# Patient Record
Sex: Male | Born: 1975 | Race: White | Hispanic: No | Marital: Married | Smoking: Former smoker
Health system: Southern US, Community
[De-identification: ages and names within clinical notes are randomized; demographics above are authoritative.]

## PROBLEM LIST (undated history)

## (undated) ENCOUNTER — Emergency Department (HOSPITAL_COMMUNITY): Admission: EM | Payer: Commercial Managed Care - HMO | Source: Home / Self Care

## (undated) DIAGNOSIS — J45909 Unspecified asthma, uncomplicated: Secondary | ICD-10-CM

## (undated) DIAGNOSIS — R519 Headache, unspecified: Secondary | ICD-10-CM

## (undated) DIAGNOSIS — G4700 Insomnia, unspecified: Secondary | ICD-10-CM

## (undated) DIAGNOSIS — J189 Pneumonia, unspecified organism: Secondary | ICD-10-CM

## (undated) DIAGNOSIS — Z973 Presence of spectacles and contact lenses: Secondary | ICD-10-CM

## (undated) DIAGNOSIS — J309 Allergic rhinitis, unspecified: Secondary | ICD-10-CM

## (undated) DIAGNOSIS — R942 Abnormal results of pulmonary function studies: Secondary | ICD-10-CM

## (undated) DIAGNOSIS — Z8489 Family history of other specified conditions: Secondary | ICD-10-CM

## (undated) DIAGNOSIS — E161 Other hypoglycemia: Secondary | ICD-10-CM

## (undated) DIAGNOSIS — K429 Umbilical hernia without obstruction or gangrene: Secondary | ICD-10-CM

## (undated) DIAGNOSIS — R112 Nausea with vomiting, unspecified: Secondary | ICD-10-CM

## (undated) DIAGNOSIS — I10 Essential (primary) hypertension: Secondary | ICD-10-CM

## (undated) DIAGNOSIS — R51 Headache: Secondary | ICD-10-CM

## (undated) DIAGNOSIS — Z9889 Other specified postprocedural states: Secondary | ICD-10-CM

## (undated) DIAGNOSIS — R079 Chest pain, unspecified: Secondary | ICD-10-CM

## (undated) HISTORY — DX: Allergic rhinitis, unspecified: J30.9

## (undated) HISTORY — DX: Abnormal results of pulmonary function studies: R94.2

## (undated) HISTORY — DX: Essential (primary) hypertension: I10

## (undated) HISTORY — PX: CIRCUMCISION: SUR203

## (undated) HISTORY — PX: ADENOIDECTOMY: SUR15

## (undated) HISTORY — PX: MYRINGOTOMY WITH TUBE PLACEMENT: SHX5663

## (undated) HISTORY — PX: WISDOM TOOTH EXTRACTION: SHX21

## (undated) HISTORY — DX: Chest pain, unspecified: R07.9

## (undated) HISTORY — DX: Insomnia, unspecified: G47.00

---

## 2001-12-21 ENCOUNTER — Encounter: Payer: Self-pay | Admitting: Family Medicine

## 2001-12-21 ENCOUNTER — Encounter: Admission: RE | Admit: 2001-12-21 | Discharge: 2001-12-21 | Payer: Self-pay | Admitting: Family Medicine

## 2002-01-12 ENCOUNTER — Encounter: Admission: RE | Admit: 2002-01-12 | Discharge: 2002-01-12 | Payer: Self-pay | Admitting: Family Medicine

## 2003-02-11 ENCOUNTER — Emergency Department (HOSPITAL_COMMUNITY): Admission: EM | Admit: 2003-02-11 | Discharge: 2003-02-11 | Payer: Self-pay | Admitting: Emergency Medicine

## 2003-02-11 ENCOUNTER — Encounter: Payer: Self-pay | Admitting: Emergency Medicine

## 2003-02-13 ENCOUNTER — Encounter: Admission: RE | Admit: 2003-02-13 | Discharge: 2003-03-16 | Payer: Self-pay | Admitting: Family Medicine

## 2015-02-23 ENCOUNTER — Other Ambulatory Visit: Payer: Self-pay | Admitting: Family Medicine

## 2015-02-23 ENCOUNTER — Ambulatory Visit
Admission: RE | Admit: 2015-02-23 | Discharge: 2015-02-23 | Disposition: A | Discharge status: Home / Self Care | Payer: 59 | Source: Ambulatory Visit | Attending: Family Medicine | Admitting: Family Medicine

## 2015-02-23 DIAGNOSIS — R059 Cough, unspecified: Secondary | ICD-10-CM

## 2015-02-23 DIAGNOSIS — R05 Cough: Secondary | ICD-10-CM

## 2015-02-23 DIAGNOSIS — R0600 Dyspnea, unspecified: Secondary | ICD-10-CM

## 2015-04-24 ENCOUNTER — Other Ambulatory Visit: Payer: Self-pay | Admitting: *Deleted

## 2015-04-24 DIAGNOSIS — R0600 Dyspnea, unspecified: Secondary | ICD-10-CM

## 2015-04-25 ENCOUNTER — Ambulatory Visit (INDEPENDENT_AMBULATORY_CARE_PROVIDER_SITE_OTHER): Payer: 59 | Admitting: Internal Medicine

## 2015-04-25 DIAGNOSIS — R0600 Dyspnea, unspecified: Secondary | ICD-10-CM | POA: Diagnosis not present

## 2015-04-25 LAB — PULMONARY FUNCTION TEST
DL/VA % pred: 140 %
DL/VA: 7.17 ml/min/mmHg/L
DLCO unc % pred: 84 %
DLCO unc: 37.66 ml/min/mmHg
FEF 25-75 Post: 2.27 L/sec
FEF 25-75 Pre: 2.23 L/sec
FEF2575-%Change-Post: 1 %
FEF2575-%Pred-Post: 45 %
FEF2575-%Pred-Pre: 45 %
FEV1-%Change-Post: 0 %
FEV1-%Pred-Post: 53 %
FEV1-%Pred-Pre: 53 %
FEV1-Post: 2.94 L
FEV1-Pre: 2.92 L
FEV1FVC-%Change-Post: -2 %
FEV1FVC-%Pred-Pre: 94 %
FEV6-%Change-Post: 2 %
FEV6-%Pred-Post: 58 %
FEV6-%Pred-Pre: 56 %
FEV6-Post: 3.97 L
FEV6-Pre: 3.86 L
FEV6FVC-%Change-Post: 0 %
FEV6FVC-%Pred-Post: 101 %
FEV6FVC-%Pred-Pre: 101 %
FVC-%Change-Post: 2 %
FVC-%Pred-Post: 57 %
FVC-%Pred-Pre: 55 %
FVC-Post: 3.98 L
FVC-Pre: 3.87 L
Post FEV1/FVC ratio: 74 %
Post FEV6/FVC ratio: 100 %
Pre FEV1/FVC ratio: 75 %
Pre FEV6/FVC Ratio: 100 %
RV % pred: 72 %
RV: 1.67 L
TLC % pred: 69 %
TLC: 6.08 L

## 2015-04-25 NOTE — Progress Notes (Signed)
PFT done today. 

## 2015-04-30 ENCOUNTER — Telehealth: Payer: Self-pay | Admitting: Internal Medicine

## 2015-04-30 NOTE — Telephone Encounter (Signed)
Called and spoke to [place] at Dr. Rondel Baton office, faxed final report to [place].  Nothing further needed.

## 2015-05-03 ENCOUNTER — Institutional Professional Consult (permissible substitution): Payer: 59 | Admitting: Internal Medicine

## 2015-05-07 ENCOUNTER — Ambulatory Visit (INDEPENDENT_AMBULATORY_CARE_PROVIDER_SITE_OTHER)
Admission: RE | Admit: 2015-05-07 | Discharge: 2015-05-07 | Disposition: A | Discharge status: Home / Self Care | Payer: 59 | Source: Ambulatory Visit | Attending: Internal Medicine | Admitting: Internal Medicine

## 2015-05-07 ENCOUNTER — Other Ambulatory Visit (INDEPENDENT_AMBULATORY_CARE_PROVIDER_SITE_OTHER): Payer: 59

## 2015-05-07 ENCOUNTER — Ambulatory Visit (INDEPENDENT_AMBULATORY_CARE_PROVIDER_SITE_OTHER): Payer: 59 | Admitting: Internal Medicine

## 2015-05-07 ENCOUNTER — Encounter: Payer: Self-pay | Admitting: Internal Medicine

## 2015-05-07 VITALS — BP 124/80 | HR 110 | Ht 69.0 in | Wt 227.0 lb

## 2015-05-07 DIAGNOSIS — R05 Cough: Secondary | ICD-10-CM

## 2015-05-07 DIAGNOSIS — E669 Obesity, unspecified: Secondary | ICD-10-CM

## 2015-05-07 DIAGNOSIS — J45991 Cough variant asthma: Secondary | ICD-10-CM | POA: Diagnosis not present

## 2015-05-07 DIAGNOSIS — R058 Other specified cough: Secondary | ICD-10-CM

## 2015-05-07 LAB — CBC WITH DIFFERENTIAL/PLATELET
BASOS PCT: 0.3 % (ref 0.0–3.0)
Basophils Absolute: 0 10*3/uL (ref 0.0–0.1)
EOS ABS: 0 10*3/uL (ref 0.0–0.7)
Eosinophils Relative: 0.2 % (ref 0.0–5.0)
HCT: 47.1 % (ref 39.0–52.0)
Hemoglobin: 16 g/dL (ref 13.0–17.0)
LYMPHS PCT: 18.6 % (ref 12.0–46.0)
Lymphs Abs: 2 10*3/uL (ref 0.7–4.0)
MCHC: 34.1 g/dL (ref 30.0–36.0)
MCV: 96.2 fl (ref 78.0–100.0)
Monocytes Absolute: 0.8 10*3/uL (ref 0.1–1.0)
Monocytes Relative: 6.9 % (ref 3.0–12.0)
NEUTROS PCT: 74 % (ref 43.0–77.0)
Neutro Abs: 8.1 10*3/uL — ABNORMAL HIGH (ref 1.4–7.7)
Platelets: 225 10*3/uL (ref 150.0–400.0)
RBC: 4.9 Mil/uL (ref 4.22–5.81)
RDW: 13.1 % (ref 11.5–15.5)
WBC: 10.9 10*3/uL — ABNORMAL HIGH (ref 4.0–10.5)

## 2015-05-07 MED ORDER — BUDESONIDE-FORMOTEROL FUMARATE 80-4.5 MCG/ACT IN AERO: 2.0000 | INHALATION_SPRAY | Freq: Two times a day (BID) | RESPIRATORY_TRACT | Status: DC

## 2015-05-07 NOTE — Patient Instructions (Addendum)
Please remember to go to the lab and x-ray department downstairs for your tests - we will call you with the results when they are available.  Reduce symbicort to 80 Take 2 puffs first thing in am and then another 2 puffs about 12 hours later.   Work on Musician technique:  relax and gently blow all the way out then take a nice smooth deep breath back in, triggering the inhaler at same time you start breathing in.  Hold for up to 5 seconds if you can. Blow out thru nose.  Rinse and gargle with water when done      If doing well on the 80 fill it   You do not have copd and never well   You have cough variant cough > symbiocort 80 2 every 12hours, evaluate you sinus and for allergies /and reflux treatment until surgery   Try prilosec 20mg   Take 30-60 min before first meal of the day and Pepcid (famotidine) 20 mg one bedtime until cough is completely gone for at least a week without the need for cough suppression  GERD (REFLUX)  is an extremely common cause of respiratory symptoms just like yours , many times with no obvious heartburn at all.    It can be treated with medication, but also with lifestyle changes including elevation of the head of your bed (ideally with 6 inch  bed blocks),  Smoking cessation, avoidance of late meals, excessive alcohol, and avoid fatty foods, chocolate, peppermint, colas, red wine, and acidic juices such as orange juice.  NO MINT OR MENTHOL PRODUCTS SO NO COUGH DROPS  USE SUGARLESS CANDY INSTEAD (Jolley ranchers or Stover's or Life Savers) or even ice chips will also do - the key is to swallow to prevent all throat clearing. NO OIL BASED VITAMINS - use powdered substitutes.    You are cleared for surgery - work on your weight in the meantime   Weight control is simply a matter of calorie balance which needs to be tilted in your favor by eating less and exercising more.  To get the most out of exercise, you need to be continuously aware that you are  short of breath, but never out of breath, for 30 minutes daily. As you improve, it will actually be easier for you to do the same amount of exercise  in  30 minutes so always push to the level where you are short of breath.  If this does not result in gradual weight reduction then I strongly recommend you see a nutritionist with a food diary x 2 weeks so that we can work out a negative calorie balance which is universally effective in steady weight loss programs.  Think of your calorie balance like you do your bank account where in this case you want the balance to go down so you must take in less calories than you burn up.  It's just that simple:  Hard to do, but easy to understand.  Good luck!

## 2015-05-07 NOTE — Progress Notes (Signed)
Subjective:    Patient ID: Andrew Delgado, male    DOB: 03-11-1976,     MRN: [id number]  HPI  39 yowm quit smoking feb 2012 with h/o freq sinus infections "always go into chest" much worse after first year he quit smoking and then some better but still at least twice yearly sinus bronchitis typically spng > fall.  Eval by  Dr Stefan Church age 39 no shots but nebulizer to age 29 > then better and able to do sports in HS/ obstacle course for [employer] referred by Dr Modesto Charon  05/07/2015 p CAP April 2016 and never really back to baseline with relapsing cough despite symbicort 160 2bid new start.   05/07/2015 1st [facility] office visit/ [name]  / needs clearance for [name] for [facility] Chief Complaint  Patient presents with  . Pulmonary Consult    Referred by Dr. Sigmund Hazel.  Pt c/o congestion and cough for the past 2 months. He also c/o PND and cough is occ prod with clear sputum.   cough since April 2016/ abrupt onset with CAP then with persistent sense of pnds > yellow sputum esp in am's but not excessive on symbicort 160 2bid  Worse p showers/ really Not limited by breathing from desired activities  But not that aerobically active since his acute pna in April 2016   No obvious day to day or daytime variabilty or assoc chronic cough or cp or chest tightness, subjective wheeze overt sinus or hb symptoms. No unusual exp hx or h/o childhood pna/ asthma or knowledge of premature birth.  Sleeping ok without nocturnal  or early am exacerbation  of respiratory  c/o's or need for noct saba. Also denies any obvious fluctuation of symptoms with weather or environmental changes or other aggravating or alleviating factors except as outlined above   Current Medications, Allergies, Complete Past Medical History, Past Surgical History, Family History, and Social History were reviewed in [employer] record.        Review of Systems  Constitutional: Negative for fever, chills,  activity change, appetite change and unexpected weight change.  HENT: Positive for congestion. Negative for dental problem, postnasal drip, rhinorrhea, sneezing, sore throat, trouble swallowing and voice change.   Eyes: Negative for visual disturbance.  Respiratory: Positive for cough. Negative for choking and shortness of breath.   Cardiovascular: Negative for chest pain and leg swelling.  Gastrointestinal: Positive for abdominal pain. Negative for nausea and vomiting.  Genitourinary: Negative for difficulty urinating.  Musculoskeletal: Negative for arthralgias.  Skin: Negative for rash.  Psychiatric/Behavioral: Negative for behavioral problems and confusion.       Objective:   Physical Exam  amb wm nad / nasal tone to voice   Wt Readings from Last 3 Encounters:  05/07/15 227 lb (102.967 kg)    Vital signs reviewed   HEENT: nl dentition, turbinates, and orophanx. Nl external ear canals without cough reflex   NECK :  without JVD/Nodes/TM/ nl carotid upstrokes bilaterally   LUNGS: no acc muscle use, clear to A and P bilaterally without cough on insp or exp maneuvers   CV:  RRR  no s3 or murmur or increase in P2, no edema   ABD:  soft and nontender with nl excursion in the supine position. No bruits or organomegaly, bowel sounds nl  MS:  warm without deformities, calf tenderness, cyanosis or clubbing  SKIN: warm and dry without lesions    NEURO:  alert, approp, no deficits  I personally reviewed images and agree with radiology impression as follows:  CXR:  05/07/15 There has been complete clearing of the pneumonia in the left upper lobe. The lungs are now clear. Heart size and vascularity are normal. No effusions. No osseous abnormality.          Assessment & Plan:

## 2015-05-08 ENCOUNTER — Telehealth: Payer: Self-pay | Admitting: Internal Medicine

## 2015-05-08 ENCOUNTER — Telehealth: Payer: Self-pay | Admitting: *Deleted

## 2015-05-08 ENCOUNTER — Encounter: Payer: Self-pay | Admitting: Internal Medicine

## 2015-05-08 DIAGNOSIS — E669 Obesity, unspecified: Secondary | ICD-10-CM | POA: Insufficient documentation

## 2015-05-08 LAB — ALLERGY FULL PROFILE
Allergen, D pternoyssinus,d7: <0.1 kU/L
Allergen,Goose feathers, e70: <0.1 kU/L
Alternaria Alternata: <0.1 kU/L
Bahia Grass: <0.1 kU/L
Candida Albicans: <0.1 kU/L
Common Ragweed: <0.1 kU/L
Curvularia lunata: <0.1 kU/L
Fescue: <0.1 kU/L
G005 Rye, Perennial: <0.1 kU/L
Goldenrod: <0.1 kU/L
Helminthosporium halodes: <0.1 kU/L
IgE (Immunoglobulin E), Serum: <2 kU/L (ref <115)
Lamb's Quarters: <0.1 kU/L
Oak: <0.1 kU/L
Plantain: <0.1 kU/L
Stemphylium Botryosum: <0.1 kU/L
Sycamore Tree: <0.1 kU/L

## 2015-05-08 NOTE — Telephone Encounter (Signed)
Christen Butter, CMA at 05/08/2015 3:14 PM     Status: Signed       Expand All Collapse All   ----- Message from Nyoka Cowden, MD sent at 05/08/2015 3:09 PM EDT ----- Tell him allergy tests are neg      --  Pt is aware of results. Nothing further needed

## 2015-05-08 NOTE — Telephone Encounter (Signed)
LMOM with results

## 2015-05-08 NOTE — Assessment & Plan Note (Addendum)
-   PFTs 04/25/15 restrictive only   Not really clear this is asthma or just UACS (see sep a/p) but if it is asthma it is relatively mild and may not be chronic but rather intermittent and driven by sinus dez, which is what his hx suggests  rec trial of symb 80 2bid using the theory that sometimes less is more, esp if uacs is present  The proper method of use, as well as anticipated side effects, of a metered-dose inhaler are discussed and demonstrated to the patient. Improved effectiveness after extensive coaching during this visit to a level of approximately  90%

## 2015-05-08 NOTE — Telephone Encounter (Signed)
-----   Message from Nyoka Cowden, MD sent at 05/08/2015  3:09 PM EDT ----- Tell him allergy tests are neg

## 2015-05-08 NOTE — Progress Notes (Signed)
Quick Note:  Spoke with pt and notified of results per Dr. Wert. Pt verbalized understanding and denied any questions.  ______ 

## 2015-05-08 NOTE — Assessment & Plan Note (Signed)
The most common causes of chronic cough in immunocompetent adults include the following: upper airway cough syndrome (UACS), previously referred to as postnasal drip syndrome (PNDS), which is caused by variety of rhinosinus conditions; (2) asthma; (3) GERD; (4) chronic bronchitis from cigarette smoking or other inhaled environmental irritants; (5) nonasthmatic eosinophilic bronchitis; and (6) bronchiectasis.   These conditions, singly or in combination, have accounted for up to 94% of the causes of chronic cough in prospective studies.   Other conditions have constituted no >6% of the causes in prospective studies These have included bronchogenic carcinoma, chronic interstitial pneumonia, sarcoidosis, left ventricular failure, ACEI-induced cough, and aspiration from a condition associated with pharyngeal dysfunction.    Chronic cough is often simultaneously caused by more than one condition. A single cause has been found from 38 to 82% of the time, multiple causes from 18 to 62%. Multiply caused cough has been the result of three diseases up to 42% of the time.       Based on hx and exam, this is most likely:  Classic Upper airway cough syndrome, so named because it's frequently impossible to sort out how much is  CR/sinusitis with freq throat clearing (which can be related to primary GERD)   vs  causing  secondary (" extra esophageal")  GERD from wide swings in gastric pressure that occur with throat clearing, often  promoting self use of mint and menthol lozenges that reduce the lower esophageal sphincter tone and exacerbate the problem further in a cyclical fashion.   These are the same pts (now being labeled as having "irritable larynx syndrome" by some cough centers) who not infrequently have a history of having failed to tolerate ace inhibitors,  dry powder inhalers or biphosphonates or report having atypical reflux symptoms that don't respond to standard doses of PPI , and are easily confused as  having aecopd or asthma flares by even experienced allergists/ pulmonologists.   The first step is to maximize acid suppression and  Reduce symbicort dose/ eval for sinus dz then regroup if not better   I had an extended discussion with the patient reviewing all relevant studies completed to date and  lasting 35 m Each maintenance medication was reviewed in detail including most importantly the difference between maintenance and as needed and under what circumstances the prns are to be used.  Please see instructions for details which were reviewed in writing and the patient given a copy.

## 2015-05-08 NOTE — Assessment & Plan Note (Signed)
pfts 04/25/15 restrictive with disproportionate reduction of erv at  Body mass index is 33.51  No tsh on file  rec neg cal bal/ needs TSH check if not done >f/u per primary care

## 2015-05-08 NOTE — Telephone Encounter (Signed)
Pt aware of results 

## 2015-05-11 ENCOUNTER — Ambulatory Visit (INDEPENDENT_AMBULATORY_CARE_PROVIDER_SITE_OTHER)
Admission: RE | Admit: 2015-05-11 | Discharge: 2015-05-11 | Disposition: A | Discharge status: Home / Self Care | Payer: Commercial Managed Care - HMO | Source: Ambulatory Visit | Attending: Internal Medicine | Admitting: Internal Medicine

## 2015-05-11 DIAGNOSIS — R05 Cough: Secondary | ICD-10-CM

## 2015-05-11 DIAGNOSIS — R058 Other specified cough: Secondary | ICD-10-CM

## 2015-05-22 ENCOUNTER — Ambulatory Visit: Payer: Self-pay | Admitting: General Surgery

## 2015-05-22 NOTE — H&P (Signed)
History of Present Illness Andrew Delgado(Calandria Mullings MD; 05/14/2015 3:01 PM) Patient words: evaluate for umbilcal hernia.  The patient is a 39 year old male who presents with an incisional hernia. Patient is a 39 year old male who is referred by [name], PA. Patient states that he is noticed a umbilical hernia bulge and pain just superior to the umbilicus for the last several months. He states it's waxing on and off. 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  Patient's had no signs or symptoms of incarceration or strangulation    Other Problems Andrew Delgado([name], CMA; 05/14/2015 2:50 PM) High blood pressure Umbilical Hernia Repair  Diagnostic Studies History Andrew Delgado([name], CMA; 05/14/2015 2:50 PM) Colonoscopy never  Allergies Andrew Delgado([name], CMA; 05/14/2015 2:51 PM) Chantix *PSYCHOTHERAPEUTIC AND NEUROLOGICAL AGENTS - MISC.*  Medication History Andrew Delgado([name], CMA; 05/14/2015 2:54 PM) Zolpidem Tartrate (10MG  Tablet, Oral) Active. Losartan Potassium (50MG  Tablet, Oral) Active. Guaifenesin DM (30-600MG  Tablet ER 12HR, Oral) Active. ZyrTEC (10MG  Tablet Chewable, Oral) Active. Symbicort (80-4.5MCG/ACT Aerosol, Inhalation) Active.  Social History Andrew Delgado([name]; 05/14/2015 2:50 PM) Alcohol use Occasional alcohol use. Caffeine use Coffee. No drug use Tobacco use Former smoker.  Family History Andrew Delgado([name], [place]; 05/14/2015 2:50 PM) Anesthetic complications Mother. Arthritis Mother. Cerebrovascular Accident Father. Heart Disease Family Members In General, Mother. Heart disease in male family member before age 38 Hypertension Family Members In General, Father, Mother.  Review of Systems Andrew Delgado([name] CMA; 05/14/2015 2:50 PM) General Not Present- Appetite Loss, Chills, Fatigue, Fever, Night Sweats, Weight Gain and Weight Loss. Skin Not Present- Change in Wart/Mole, Dryness, Hives, Jaundice, New Lesions, Non-Healing  Wounds, Rash and Ulcer. HEENT Present- Seasonal Allergies. Not Present- Earache, Hearing Loss, Hoarseness, Nose Bleed, Oral Ulcers, Ringing in the Ears, Sinus Pain, Sore Throat, Visual Disturbances, Wears glasses/contact lenses and Yellow Eyes. Respiratory Not Present- Bloody sputum, Chronic Cough, Difficulty Breathing, Snoring and Wheezing. Breast Not Present- Breast Mass, Breast Pain, Nipple Discharge and Skin Changes. Cardiovascular Not Present- Chest Pain, Difficulty Breathing Lying Down, Leg Cramps, Palpitations, Rapid Heart Rate, Shortness of Breath and Swelling of Extremities. Gastrointestinal Present- Abdominal Pain. Not Present- Bloating, Bloody Stool, Change in Bowel Habits, Chronic diarrhea, Constipation, Difficulty Swallowing, Excessive gas, Gets full quickly at meals, Hemorrhoids, Indigestion, Nausea, Rectal Pain and Vomiting. Neurological Not Present- Decreased Memory, Fainting, Headaches, Numbness, Seizures, Tingling, Tremor, Trouble walking and Weakness. Psychiatric Not Present- Anxiety, Bipolar, Change in Sleep Pattern, Depression, Fearful and Frequent crying. Endocrine Not Present- Cold Intolerance, Excessive Hunger, Hair Changes, Heat Intolerance, Hot flashes and New Diabetes. Hematology Not Present- Easy Bruising, Excessive bleeding, Gland problems, HIV and Persistent Infections.   Vitals Andrew Delgado([name] CMA; 05/14/2015 2:50 PM) 05/14/2015 2:50 PM Weight: 224.6 lb Height: 69in Body Surface Area: 2.23 m Body Mass Index: 33.17 kg/m Temp.: 98.73F(Temporal)  Pulse: 88 (Regular)  Resp.: 16 (Unlabored)  BP: 122/86 (Sitting, Left Arm, Standard)    Physical Exam Andrew Delgado(Yvett Rossel MD; 05/14/2015 3:02 PM) General Mental Status-Alert. General Appearance-Consistent with stated age. Hydration-Well hydrated. Voice-Normal.  Head and Neck Head-normocephalic, atraumatic with no lesions or palpable masses. Trachea-midline. Thyroid Gland Characteristics -  normal size and consistency.  Chest and Lung Exam Chest and lung exam reveals -quiet, even and easy respiratory effort with no use of accessory muscles and on auscultation, normal breath sounds, no adventitious sounds and normal vocal resonance. Inspection Chest Wall - Normal. Back - normal.  Cardiovascular Cardiovascular examination reveals -normal heart sounds, regular rate and rhythm with no  murmurs and normal pedal pulses bilaterally.  Abdomen Inspection Skin - Scar - no surgical scars. Hernias - Ventral - Reducible(Small approximate 1-2 cm). Umbilical hernia - Reducible(Small approximate 1-2 cm). Palpation/Percussion Normal exam - Soft, Non Tender, No Rebound tenderness, No Rigidity (guarding) and No hepatosplenomegaly. Auscultation Normal exam - Bowel sounds normal.    Assessment & Plan Andrew Filler MD; 05/14/2015 3:03 PM) VENTRAL HERNIA WITHOUT OBSTRUCTION OR GANGRENE (553.20  K43.9) Impression: 39 year old male with a ventral hernia and umbilical hernia.  1. The patient will like to proceed to the operating room for laparoscopic umbilical hernia repair.  2. I discussed with the patient the signs and symptoms of incarceration and strangulation and the need to proceed to the ER should they occur.  3. I discussed with the patient the risks and benefits of the procedure to include but not limited to: Infection, bleeding, damage to surrounding structures, possible need for further surgery, possible nerve pain, and possible recurrence. The patient was understanding and wishes to proceed. Current Plans

## 2015-06-27 ENCOUNTER — Encounter (HOSPITAL_COMMUNITY)
Admission: RE | Admit: 2015-06-27 | Discharge: 2015-06-27 | Disposition: A | Discharge status: Home / Self Care | Payer: Commercial Managed Care - HMO | Source: Ambulatory Visit | Attending: General Surgery | Admitting: General Surgery

## 2015-06-27 ENCOUNTER — Encounter (HOSPITAL_COMMUNITY): Payer: Self-pay

## 2015-06-27 DIAGNOSIS — Z01818 Encounter for other preprocedural examination: Secondary | ICD-10-CM | POA: Diagnosis present

## 2015-06-27 HISTORY — DX: Other hypoglycemia: E16.1

## 2015-06-27 HISTORY — DX: Other specified postprocedural states: Z98.890

## 2015-06-27 HISTORY — DX: Other specified postprocedural states: R11.2

## 2015-06-27 HISTORY — DX: Pneumonia, unspecified organism: J18.9

## 2015-06-27 LAB — BASIC METABOLIC PANEL
ANION GAP: 8 (ref 5–15)
BUN: 17 mg/dL (ref 6–20)
CHLORIDE: 103 mmol/L (ref 101–111)
CO2: 28 mmol/L (ref 22–32)
Calcium: 9.8 mg/dL (ref 8.9–10.3)
Creatinine, Ser: 0.81 mg/dL (ref 0.61–1.24)
GFR calc Af Amer: >60 mL/min (ref >60)
GFR calc non Af Amer: >60 mL/min (ref >60)
Glucose, Bld: 93 mg/dL (ref 65–99)
POTASSIUM: 4.2 mmol/L (ref 3.5–5.1)
Sodium: 139 mmol/L (ref 135–145)

## 2015-06-27 LAB — CBC
HCT: 45.2 % (ref 39.0–52.0)
HEMOGLOBIN: 15.5 g/dL (ref 13.0–17.0)
MCH: 33.1 pg (ref 26.0–34.0)
MCHC: 34.3 g/dL (ref 30.0–36.0)
MCV: 96.6 fL (ref 78.0–100.0)
PLATELETS: 210 10*3/uL (ref 150–400)
RBC: 4.68 MIL/uL (ref 4.22–5.81)
RDW: 12 % (ref 11.5–15.5)
WBC: 6.7 10*3/uL (ref 4.0–10.5)

## 2015-06-27 NOTE — Pre-Procedure Instructions (Addendum)
06-27-15 EKG done today. CXR 05-07-15 Epic.

## 2015-06-27 NOTE — Patient Instructions (Addendum)
20 JEORGE REISTER  06/27/2015   Your procedure is scheduled on:   06-29-2015 Friday  [facility] through [facility]  Entrance and follow signs to [facility]. Arrive at     0600   AM ..  (Limit 1 person with you).  Call this number if you have problems the morning of surgery: [phone]  Or Presurgical Testing [phone].   For Living Will and/or Health Care Power Attorney Forms: please provide copy for your medical record,may bring AM of surgery(Forms should be already notarized -we do not provide this service).(06-27-15  No information preferred today).      Do not eat food/ or drink: After Midnight.      Take these medicines the morning of surgery with A SIP OF WATER: Cetirizine. Use Flonase as usual. Use/bring Inhalers.   Do not wear jewelry, make-up or nail polish.  Do not wear deodorant, lotions, powders, or perfumes.   Do not shave legs and under arms- 48 hours(2 days) prior to first CHG shower.(Shaving face and neck okay.)  Do not bring valuables to the hospital.(Hospital is not responsible for lost valuables).  Contacts, dentures or removable bridgework, body piercing, hair pins may not be worn into surgery.  Leave suitcase in the car. After surgery it may be brought to your room.  For patients admitted to the hospital, checkout time is 11:00 AM the day of discharge.(Restricted visitors-Any Persons displaying flu-like symptoms or illness).    Patients discharged the day of surgery will not be allowed to drive home. Must have responsible person with you x 24 hours once discharged.  Name and phone number of your driver: Rebeca Alert [phone] cell     Please read over the following fact sheets that you were given:  CHG(Chlorhexidine Gluconate 4% Surgical Soap) use.        [facility] - Preparing for Surgery Before surgery, you can play an important role.  Because skin is not sterile, your skin needs to be as free of germs as possible.  You can reduce the  number of germs on your skin by washing with CHG (chlorahexidine gluconate) soap before surgery.  CHG is an antiseptic cleaner which kills germs and bonds with the skin to continue killing germs even after washing. Please DO NOT use if you have an allergy to CHG or antibacterial soaps.  If your skin becomes reddened/irritated stop using the CHG and inform your nurse when you arrive at Short Stay. Do not shave (including legs and underarms) for at least 48 hours prior to the first CHG shower.  You may shave your face/neck. Please follow these instructions carefully:  1.  Shower with CHG Soap the night before surgery and the  morning of Surgery.  2.  If you choose to wash your hair, wash your hair first as usual with your  normal  shampoo.  3.  After you shampoo, rinse your hair and body thoroughly to remove the  shampoo.                           4.  Use CHG as you would any other liquid soap.  You can apply chg directly  to the skin and wash                       Gently with a scrungie or clean washcloth.  5.  Apply the CHG Soap to your body ONLY FROM THE  NECK DOWN.   Do not use on face/ open                           Wound or open sores. Avoid contact with eyes, ears mouth and genitals (private parts).                       Wash face,  Genitals (private parts) with your normal soap.             6.  Wash thoroughly, paying special attention to the area where your surgery  will be performed.  7.  Thoroughly rinse your body with warm water from the neck down.  8.  DO NOT shower/wash with your normal soap after using and rinsing off  the CHG Soap.                9.  Pat yourself dry with a clean towel.            10.  Wear clean pajamas.            11.  Place clean sheets on your bed the night of your first shower and do not  sleep with pets. Day of Surgery : Do not apply any lotions/deodorants the morning of surgery.  Please wear clean clothes to the hospital/surgery center.  FAILURE TO FOLLOW  THESE INSTRUCTIONS MAY RESULT IN THE CANCELLATION OF YOUR SURGERY PATIENT SIGNATURE_________________________________  NURSE SIGNATURE__________________________________  ________________________________________________________________________

## 2015-06-27 NOTE — Progress Notes (Signed)
CXR 05-07-15 Epic.

## 2015-07-05 NOTE — Progress Notes (Signed)
07-05-15 1520 Spoke with pt states surgery is moved to [facility] 07-13-15. Informed him time was same, he would report to the [facility], main hospital number given as reference [phone] -ask for Short Stay for other instructions, Should remain NPO after Midnight, take same meds as instructed earlier, Follow CHG soap instructions.

## 2015-07-05 NOTE — Progress Notes (Signed)
07-05-15 0900 AM Pt. Notified by voice mail to arrive 0530 AM on 07-13-15 new surgery date/time, all other instructions should remain the same.

## 2015-07-12 ENCOUNTER — Encounter (HOSPITAL_COMMUNITY): Payer: Self-pay | Admitting: *Deleted

## 2015-07-12 MED ORDER — CEFAZOLIN SODIUM-DEXTROSE 2-3 GM-% IV SOLR: 2.0000 g | INTRAVENOUS | Status: DC

## 2015-07-12 MED ORDER — CHLORHEXIDINE GLUCONATE 4 % EX LIQD: 1.0000 "application " | Freq: Once | CUTANEOUS | Status: DC

## 2015-07-12 MED ORDER — CHLORHEXIDINE GLUCONATE 4 % EX LIQD
1.0000 "application " | Freq: Once | CUTANEOUS | Status: DC
  Filled 2015-07-12: qty 15

## 2015-07-12 MED ORDER — CEFAZOLIN SODIUM-DEXTROSE 2-3 GM-% IV SOLR
2.0000 g | INTRAVENOUS | Status: AC
  Administered 2015-07-13: 2 g via INTRAVENOUS
  Filled 2015-07-12: qty 50

## 2015-07-12 NOTE — Progress Notes (Signed)
Pt denies SOB, chest pain, and being under the care of a cardiologist. Pt denies having a stress test, echo and cardiac cath. Pt made aware to stop  taking Aspirin, otc vitamins and herbal medications. Do not take any NSAIDs ie: Ibuprofen, Advil, Naproxen or any medication containing Aspirin such as Goody Headache. Pt verbalized understanding of all pre-op instructions. 

## 2015-07-13 ENCOUNTER — Ambulatory Visit (HOSPITAL_COMMUNITY): Payer: Commercial Managed Care - HMO | Admitting: Anesthesiology

## 2015-07-13 ENCOUNTER — Ambulatory Visit (HOSPITAL_COMMUNITY)
Admission: RE | Admit: 2015-07-13 | Discharge: 2015-07-13 | Disposition: A | Discharge status: Home / Self Care | Payer: Commercial Managed Care - HMO | Source: Ambulatory Visit | Attending: General Surgery | Admitting: General Surgery

## 2015-07-13 ENCOUNTER — Encounter (HOSPITAL_COMMUNITY): Payer: Self-pay | Admitting: *Deleted

## 2015-07-13 ENCOUNTER — Encounter (HOSPITAL_COMMUNITY)
Admission: RE | Disposition: A | Discharge status: Home / Self Care | Payer: Self-pay | Source: Ambulatory Visit | Attending: General Surgery

## 2015-07-13 DIAGNOSIS — Z87891 Personal history of nicotine dependence: Secondary | ICD-10-CM | POA: Diagnosis not present

## 2015-07-13 DIAGNOSIS — K436 Other and unspecified ventral hernia with obstruction, without gangrene: Secondary | ICD-10-CM | POA: Insufficient documentation

## 2015-07-13 DIAGNOSIS — K420 Umbilical hernia with obstruction, without gangrene: Secondary | ICD-10-CM | POA: Diagnosis not present

## 2015-07-13 DIAGNOSIS — Z7951 Long term (current) use of inhaled steroids: Secondary | ICD-10-CM | POA: Insufficient documentation

## 2015-07-13 DIAGNOSIS — Z79899 Other long term (current) drug therapy: Secondary | ICD-10-CM | POA: Diagnosis not present

## 2015-07-13 DIAGNOSIS — K429 Umbilical hernia without obstruction or gangrene: Secondary | ICD-10-CM | POA: Diagnosis present

## 2015-07-13 DIAGNOSIS — I10 Essential (primary) hypertension: Secondary | ICD-10-CM | POA: Diagnosis not present

## 2015-07-13 DIAGNOSIS — J45909 Unspecified asthma, uncomplicated: Secondary | ICD-10-CM | POA: Diagnosis not present

## 2015-07-13 HISTORY — DX: Unspecified asthma, uncomplicated: J45.909

## 2015-07-13 HISTORY — DX: Umbilical hernia without obstruction or gangrene: K42.9

## 2015-07-13 HISTORY — DX: Presence of spectacles and contact lenses: Z97.3

## 2015-07-13 HISTORY — PX: UMBILICAL HERNIA REPAIR: SHX196

## 2015-07-13 HISTORY — DX: Headache, unspecified: R51.9

## 2015-07-13 HISTORY — DX: Family history of other specified conditions: Z84.89

## 2015-07-13 HISTORY — PX: INSERTION OF MESH: SHX5868

## 2015-07-13 HISTORY — DX: Headache: R51

## 2015-07-13 LAB — CBC
HCT: 40.8 % (ref 39.0–52.0)
Hemoglobin: 14.3 g/dL (ref 13.0–17.0)
MCH: 33.3 pg (ref 26.0–34.0)
MCHC: 35 g/dL (ref 30.0–36.0)
MCV: 94.9 fL (ref 78.0–100.0)
PLATELETS: 171 10*3/uL (ref 150–400)
RBC: 4.3 MIL/uL (ref 4.22–5.81)
RDW: 12.2 % (ref 11.5–15.5)
WBC: 6.3 10*3/uL (ref 4.0–10.5)

## 2015-07-13 LAB — BASIC METABOLIC PANEL
Anion gap: 8 (ref 5–15)
BUN: 15 mg/dL (ref 6–20)
CALCIUM: 9.3 mg/dL (ref 8.9–10.3)
CO2: 28 mmol/L (ref 22–32)
CREATININE: 1.02 mg/dL (ref 0.61–1.24)
Chloride: 103 mmol/L (ref 101–111)
Glucose, Bld: 113 mg/dL — ABNORMAL HIGH (ref 65–99)
Potassium: 3.9 mmol/L (ref 3.5–5.1)
SODIUM: 139 mmol/L (ref 135–145)

## 2015-07-13 SURGERY — REPAIR, HERNIA, UMBILICAL, LAPAROSCOPIC
Anesthesia: General | Site: Abdomen

## 2015-07-13 MED ORDER — LIDOCAINE HCL (CARDIAC) 20 MG/ML IV SOLN
INTRAVENOUS | Status: DC | PRN
  Administered 2015-07-13: 100 mg via INTRAVENOUS

## 2015-07-13 MED ORDER — HYDROMORPHONE HCL 1 MG/ML IJ SOLN
INTRAMUSCULAR | Status: AC
  Administered 2015-07-13: 0.5 mg via INTRAVENOUS
  Filled 2015-07-13: qty 1

## 2015-07-13 MED ORDER — OXYCODONE HCL 5 MG PO TABS
5.0000 mg | ORAL_TABLET | ORAL | Status: DC | PRN
  Administered 2015-07-13: 10 mg via ORAL

## 2015-07-13 MED ORDER — SODIUM CHLORIDE 0.9 % IJ SOLN: 3.0000 mL | INTRAMUSCULAR | Status: DC | PRN

## 2015-07-13 MED ORDER — DEXAMETHASONE SODIUM PHOSPHATE 4 MG/ML IJ SOLN
INTRAMUSCULAR | Status: AC
  Filled 2015-07-13: qty 1

## 2015-07-13 MED ORDER — HYDROMORPHONE HCL 1 MG/ML IJ SOLN
0.2500 mg | INTRAMUSCULAR | Status: DC | PRN
  Administered 2015-07-13 (×4): 0.5 mg via INTRAVENOUS

## 2015-07-13 MED ORDER — OXYCODONE HCL 5 MG PO TABS
ORAL_TABLET | ORAL | Status: AC
  Filled 2015-07-13: qty 2

## 2015-07-13 MED ORDER — SODIUM CHLORIDE 0.9 % IJ SOLN: 3.0000 mL | Freq: Two times a day (BID) | INTRAMUSCULAR | Status: DC

## 2015-07-13 MED ORDER — SCOPOLAMINE 1 MG/3DAYS TD PT72
1.0000 | MEDICATED_PATCH | Freq: Once | TRANSDERMAL | Status: DC
  Administered 2015-07-13: 1.5 mg via TRANSDERMAL
  Filled 2015-07-13: qty 1

## 2015-07-13 MED ORDER — SUCCINYLCHOLINE CHLORIDE 20 MG/ML IJ SOLN
INTRAMUSCULAR | Status: AC
  Filled 2015-07-13: qty 1

## 2015-07-13 MED ORDER — FENTANYL CITRATE (PF) 100 MCG/2ML IJ SOLN
INTRAMUSCULAR | Status: DC | PRN
  Administered 2015-07-13 (×2): 50 ug via INTRAVENOUS
  Administered 2015-07-13: 100 ug via INTRAVENOUS
  Administered 2015-07-13: 50 ug via INTRAVENOUS

## 2015-07-13 MED ORDER — MIDAZOLAM HCL 2 MG/2ML IJ SOLN
INTRAMUSCULAR | Status: AC
  Filled 2015-07-13: qty 4

## 2015-07-13 MED ORDER — SODIUM CHLORIDE 0.9 % IV SOLN: 250.0000 mL | INTRAVENOUS | Status: DC | PRN

## 2015-07-13 MED ORDER — NEOSTIGMINE METHYLSULFATE 10 MG/10ML IV SOLN
INTRAVENOUS | Status: AC
  Filled 2015-07-13: qty 1

## 2015-07-13 MED ORDER — ACETAMINOPHEN 325 MG PO TABS: 650.0000 mg | ORAL_TABLET | ORAL | Status: DC | PRN

## 2015-07-13 MED ORDER — DEXAMETHASONE SODIUM PHOSPHATE 4 MG/ML IJ SOLN
INTRAMUSCULAR | Status: DC | PRN
  Administered 2015-07-13: 8 mg via INTRAVENOUS

## 2015-07-13 MED ORDER — ONDANSETRON HCL 4 MG/2ML IJ SOLN
INTRAMUSCULAR | Status: AC
  Filled 2015-07-13: qty 2

## 2015-07-13 MED ORDER — LACTATED RINGERS IV SOLN
INTRAVENOUS | Status: DC | PRN
  Administered 2015-07-13 (×2): via INTRAVENOUS

## 2015-07-13 MED ORDER — 0.9 % SODIUM CHLORIDE (POUR BTL) OPTIME
TOPICAL | Status: DC | PRN
  Administered 2015-07-13: 1000 mL

## 2015-07-13 MED ORDER — SUCCINYLCHOLINE CHLORIDE 20 MG/ML IJ SOLN
INTRAMUSCULAR | Status: DC | PRN
  Administered 2015-07-13: 100 mg via INTRAVENOUS

## 2015-07-13 MED ORDER — FENTANYL CITRATE (PF) 250 MCG/5ML IJ SOLN
INTRAMUSCULAR | Status: AC
  Filled 2015-07-13: qty 5

## 2015-07-13 MED ORDER — PROPOFOL 10 MG/ML IV BOLUS
INTRAVENOUS | Status: DC | PRN
  Administered 2015-07-13: 140 mg via INTRAVENOUS

## 2015-07-13 MED ORDER — ACETAMINOPHEN 650 MG RE SUPP: 650.0000 mg | RECTAL | Status: DC | PRN

## 2015-07-13 MED ORDER — OXYCODONE-ACETAMINOPHEN 5-325 MG PO TABS: 1.0000 | ORAL_TABLET | ORAL | Status: DC | PRN

## 2015-07-13 MED ORDER — BUPIVACAINE HCL (PF) 0.25 % IJ SOLN
INTRAMUSCULAR | Status: AC
  Filled 2015-07-13: qty 30

## 2015-07-13 MED ORDER — ROCURONIUM BROMIDE 50 MG/5ML IV SOLN
INTRAVENOUS | Status: AC
  Filled 2015-07-13: qty 1

## 2015-07-13 MED ORDER — ROCURONIUM BROMIDE 100 MG/10ML IV SOLN
INTRAVENOUS | Status: DC | PRN
  Administered 2015-07-13: 30 mg via INTRAVENOUS
  Administered 2015-07-13: 10 mg via INTRAVENOUS

## 2015-07-13 MED ORDER — BUPIVACAINE HCL 0.25 % IJ SOLN
INTRAMUSCULAR | Status: DC | PRN
  Administered 2015-07-13: 8 mL

## 2015-07-13 MED ORDER — PROPOFOL 10 MG/ML IV BOLUS
INTRAVENOUS | Status: AC
  Filled 2015-07-13: qty 20

## 2015-07-13 MED ORDER — GLYCOPYRROLATE 0.2 MG/ML IJ SOLN
INTRAMUSCULAR | Status: AC
  Filled 2015-07-13: qty 3

## 2015-07-13 SURGICAL SUPPLY — 51 items
APPLIER CLIP LOGIC TI 5 (MISCELLANEOUS) IMPLANT
APPLIER CLIP ROT 10 11.4 M/L (STAPLE)
BENZOIN TINCTURE PRP APPL 2/3 (GAUZE/BANDAGES/DRESSINGS) ×2 IMPLANT
BLADE SURG ROTATE 9660 (MISCELLANEOUS) IMPLANT
CANISTER SUCTION 2500CC (MISCELLANEOUS) IMPLANT
CHLORAPREP W/TINT 26ML (MISCELLANEOUS) ×2 IMPLANT
CLIP APPLIE ROT 10 11.4 M/L (STAPLE) IMPLANT
COVER SURGICAL LIGHT HANDLE (MISCELLANEOUS) ×2 IMPLANT
DEVICE SECURE STRAP 25 ABSORB (INSTRUMENTS) ×4 IMPLANT
DEVICE TROCAR PUNCTURE CLOSURE (ENDOMECHANICALS) ×2 IMPLANT
DRAPE LAPAROSCOPIC ABDOMINAL (DRAPES) ×2 IMPLANT
ELECT REM PT RETURN 9FT ADLT (ELECTROSURGICAL) ×2
ELECTRODE REM PT RTRN 9FT ADLT (ELECTROSURGICAL) ×1 IMPLANT
GAUZE SPONGE 2X2 8PLY STRL LF (GAUZE/BANDAGES/DRESSINGS) ×1 IMPLANT
GAUZE SPONGE 4X4 12PLY STRL (GAUZE/BANDAGES/DRESSINGS) ×2 IMPLANT
GLOVE BIO SURGEON STRL SZ7 (GLOVE) ×2 IMPLANT
GLOVE BIO SURGEON STRL SZ7.5 (GLOVE) ×2 IMPLANT
GLOVE BIOGEL PI IND STRL 7.0 (GLOVE) ×2 IMPLANT
GLOVE BIOGEL PI IND STRL 7.5 (GLOVE) ×1 IMPLANT
GLOVE BIOGEL PI INDICATOR 7.0 (GLOVE) ×2
GLOVE BIOGEL PI INDICATOR 7.5 (GLOVE) ×1
GLOVE ECLIPSE 7.5 STRL STRAW (GLOVE) ×2 IMPLANT
GOWN STRL REUS W/ TWL LRG LVL3 (GOWN DISPOSABLE) ×2 IMPLANT
GOWN STRL REUS W/ TWL XL LVL3 (GOWN DISPOSABLE) ×1 IMPLANT
GOWN STRL REUS W/TWL LRG LVL3 (GOWN DISPOSABLE) ×2
GOWN STRL REUS W/TWL XL LVL3 (GOWN DISPOSABLE) ×1
KIT BASIN OR (CUSTOM PROCEDURE TRAY) ×2 IMPLANT
KIT ROOM TURNOVER OR (KITS) ×2 IMPLANT
MARKER SKIN DUAL TIP RULER LAB (MISCELLANEOUS) ×2 IMPLANT
MESH VENTRALIGHT ST 6IN CRC (Mesh General) ×2 IMPLANT
NEEDLE INSUFFLATION 14GA 120MM (NEEDLE) ×2 IMPLANT
NEEDLE SPNL 22GX3.5 QUINCKE BK (NEEDLE) IMPLANT
NS IRRIG 1000ML POUR BTL (IV SOLUTION) ×2 IMPLANT
PAD ARMBOARD 7.5X6 YLW CONV (MISCELLANEOUS) ×4 IMPLANT
SCISSORS LAP 5X35 DISP (ENDOMECHANICALS) ×2 IMPLANT
SET IRRIG TUBING LAPAROSCOPIC (IRRIGATION / IRRIGATOR) IMPLANT
SLEEVE ENDOPATH XCEL 5M (ENDOMECHANICALS) ×8 IMPLANT
SPONGE GAUZE 2X2 STER 10/PKG (GAUZE/BANDAGES/DRESSINGS) ×1
STRIP CLOSURE SKIN 1/4X4 (GAUZE/BANDAGES/DRESSINGS) ×2 IMPLANT
SUT CHROMIC 2 0 SH (SUTURE) ×2 IMPLANT
SUT MNCRL AB 4-0 PS2 18 (SUTURE) ×4 IMPLANT
SUT PROLENE 2 0 KS (SUTURE) IMPLANT
TAPE CLOTH SOFT 2X10 (GAUZE/BANDAGES/DRESSINGS) ×2 IMPLANT
TOWEL OR 17X24 6PK STRL BLUE (TOWEL DISPOSABLE) ×2 IMPLANT
TOWEL OR 17X26 10 PK STRL BLUE (TOWEL DISPOSABLE) ×2 IMPLANT
TRAY FOLEY CATH 14FR (SET/KITS/TRAYS/PACK) IMPLANT
TRAY LAPAROSCOPIC MC (CUSTOM PROCEDURE TRAY) ×2 IMPLANT
TROCAR XCEL BLUNT TIP 100MML (ENDOMECHANICALS) IMPLANT
TROCAR XCEL NON-BLD 11X100MML (ENDOMECHANICALS) IMPLANT
TROCAR XCEL NON-BLD 5MMX100MML (ENDOMECHANICALS) ×2 IMPLANT
TUBING INSUFFLATION (TUBING) ×2 IMPLANT

## 2015-07-13 NOTE — Transfer of Care (Signed)
Immediate Anesthesia Transfer of Care Note  Patient: Andrew Delgado  Procedure(s) Performed: Procedure(s): LAPAROSCOPIC UMBILICAL HERNIA REPAIR WITH MESH  X 2  (N/A) INSERTION OF MESH (N/A)  Patient Location: PACU  Anesthesia Type:General  Level of Consciousness: awake, alert , oriented, patient cooperative and responds to stimulation  Airway & Oxygen Therapy: Patient Spontanous Breathing and Patient connected to face mask oxygen  Post-op Assessment: Report given to RN and Post -op Vital signs reviewed and stable  Post vital signs: Reviewed and stable  Last Vitals:  Filed Vitals:   07/13/15 0849  BP: 155/99  Pulse: 105  Temp: 36.6 C  Resp:     Complications: No apparent anesthesia complications

## 2015-07-13 NOTE — H&P (Signed)
History of Present Illness Andrew Delgado(Getsemani Lindon MD; 05/14/2015 3:01 PM) Patient words: evaluate for umbilcal hernia.  The patient is a 39 year old male who presents with an incisional hernia. Patient is a 39 year old male who is referred by [name], PA. Patient states that he is noticed a umbilical hernia bulge and pain just superior to the umbilicus for the last several months. He states it's waxing on and off. 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  Patient's had no signs or symptoms of incarceration or strangulation    Other Problems [name]([name], CMA; 05/14/2015 2:50 PM) High blood pressure Umbilical Hernia Repair  Diagnostic Studies History [name]([name], CMA; 05/14/2015 2:50 PM) Colonoscopy never  Allergies [name]([name], CMA; 05/14/2015 2:51 PM) Chantix *PSYCHOTHERAPEUTIC AND NEUROLOGICAL AGENTS - MISC.*  Medication History [name]([name], CMA; 05/14/2015 2:54 PM) Zolpidem Tartrate (10MG  Tablet, Oral) Active. Losartan Potassium (50MG  Tablet, Oral) Active. Guaifenesin DM (30-600MG  Tablet ER 12HR, Oral) Active. ZyrTEC (10MG  Tablet Chewable, Oral) Active. Symbicort (80-4.5MCG/ACT Aerosol, Inhalation) Active.  Social History [name]([name]; 05/14/2015 2:50 PM) Alcohol use Occasional alcohol use. Caffeine use Coffee. No drug use Tobacco use Former smoker.  Family History [name]([name], [place]; 05/14/2015 2:50 PM) Anesthetic complications Mother. Arthritis Mother. Cerebrovascular Accident Father. Heart Disease Family Members In General, Mother. Heart disease in male family member before age 38 Hypertension Family Members In General, Father, Mother.  Review of Systems [name]([name] CMA; 05/14/2015 2:50 PM) General Not Present- Appetite Loss, Chills, Fatigue, Fever, Night Sweats, Weight Gain and Weight Loss. Skin Not Present- Change in Wart/Mole, Dryness, Hives, Jaundice, New Lesions, Non-Healing  Wounds, Rash and Ulcer. HEENT Present- Seasonal Allergies. Not Present- Earache, Hearing Loss, Hoarseness, Nose Bleed, Oral Ulcers, Ringing in the Ears, Sinus Pain, Sore Throat, Visual Disturbances, Wears glasses/contact lenses and Yellow Eyes. Respiratory Not Present- Bloody sputum, Chronic Cough, Difficulty Breathing, Snoring and Wheezing. Breast Not Present- Breast Mass, Breast Pain, Nipple Discharge and Skin Changes. Cardiovascular Not Present- Chest Pain, Difficulty Breathing Lying Down, Leg Cramps, Palpitations, Rapid Heart Rate, Shortness of Breath and Swelling of Extremities. Gastrointestinal Present- Abdominal Pain. Not Present- Bloating, Bloody Stool, Change in Bowel Habits, Chronic diarrhea, Constipation, Difficulty Swallowing, Excessive gas, Gets full quickly at meals, Hemorrhoids, Indigestion, Nausea, Rectal Pain and Vomiting. Neurological Not Present- Decreased Memory, Fainting, Headaches, Numbness, Seizures, Tingling, Tremor, Trouble walking and Weakness. Psychiatric Not Present- Anxiety, Bipolar, Change in Sleep Pattern, Depression, Fearful and Frequent crying. Endocrine Not Present- Cold Intolerance, Excessive Hunger, Hair Changes, Heat Intolerance, Hot flashes and New Diabetes. Hematology Not Present- Easy Bruising, Excessive bleeding, Gland problems, HIV and Persistent Infections.   Vitals [name]([name] CMA; 05/14/2015 2:50 PM) 05/14/2015 2:50 PM Weight: 224.6 lb Height: 69in Body Surface Area: 2.23 m Body Mass Index: 33.17 kg/m Temp.: 98.73F(Temporal)  Pulse: 88 (Regular)  Resp.: 16 (Unlabored)  BP: 122/86 (Sitting, Left Arm, Standard)    Physical Exam Andrew Delgado(Aubriauna Riner MD; 05/14/2015 3:02 PM) General Mental Status-Alert. General Appearance-Consistent with stated age. Hydration-Well hydrated. Voice-Normal.  Head and Neck Head-normocephalic, atraumatic with no lesions or palpable masses. Trachea-midline. Thyroid Gland Characteristics -  normal size and consistency.  Chest and Lung Exam Chest and lung exam reveals -quiet, even and easy respiratory effort with no use of accessory muscles and on auscultation, normal breath sounds, no adventitious sounds and normal vocal resonance. Inspection Chest Wall - Normal. Back - normal.  Cardiovascular Cardiovascular examination reveals -normal heart sounds, regular rate and rhythm with no  murmurs and normal pedal pulses bilaterally.  Abdomen Inspection Skin - Scar - no surgical scars. Hernias - Ventral - Reducible(Small approximate 1-2 cm). Umbilical hernia - Reducible(Small approximate 1-2 cm). Palpation/Percussion Normal exam - Soft, Non Tender, No Rebound tenderness, No Rigidity (guarding) and No hepatosplenomegaly. Auscultation Normal exam - Bowel sounds normal.    Assessment & Plan Andrew Filler MD; 05/14/2015 3:03 PM) VENTRAL HERNIA WITHOUT OBSTRUCTION OR GANGRENE (553.20  K43.9) Impression: 39 year old male with a ventral hernia and umbilical hernia.  1. The patient will like to proceed to the operating room for laparoscopic umbilical hernia repair.  2. I discussed with the patient the signs and symptoms of incarceration and strangulation and the need to proceed to the ER should they occur.  3. I discussed with the patient the risks and benefits of the procedure to include but not limited to: Infection, bleeding, damage to surrounding structures, possible need for further surgery, possible nerve pain, and possible recurrence. The patient was understanding and wishes to proceed.

## 2015-07-13 NOTE — Op Note (Signed)
07/13/2015  8:26 AM  PATIENT:  Andrew Delgado  39 y.o. male  PRE-OPERATIVE DIAGNOSIS:  UMBILICAL HERNIA X2  POST-OPERATIVE DIAGNOSIS:  Incarcerated UMBILICAL HERNIA and Ventral hernia  PROCEDURE:  Procedure(s): LAPAROSCOPIC REPAIR  OF INCARCERATED VENTRAL HERNIA WITH MESH (N/A) INSERTION OF MESH (N/A)  SURGEON:  Surgeon(s) and Role:    * Axel Filler, MD - Primary  ANESTHESIA:   general  EBL:   5CC  BLOOD ADMINISTERED:none  DRAINS: none   LOCAL MEDICATIONS USED:  BUPIVICAINE   SPECIMEN:  No Specimen  DISPOSITION OF SPECIMEN:  N/A  COUNTS:  YES  TOURNIQUET:  * No tourniquets in log *  DICTATION: .Dragon Dictation  Details of the procedure:   After the patient was consented patient was taken back to the operating room patient was then placed in supine position bilateral SCDs in place.  The patient was prepped and draped in the usual sterile fashion. After antibiotics were confirmed a timeout was called and all facts were verified. The Veress needle technique was used to insuflate the abdomen at Palmer's point. The abdomen was insufflated to 14 mm mercury. Subsequently a 5 mm trocar was placed a camera inserted there was no injury to any intra-abdominal organs.    There was seen to be an 1cm incarcerated  umbilical hernia with preperitoneal fat and ventral hernia just 1-2cm cephalad with incarcerated falciform ligament.  A second camera port was in placed into the left lower quadrant.   The umbilical hernia was reduced.  The preperitoneal fat was than taken down from the anterior abdominal wall towards the falciform ligament.  At this time the Falicform ligament was taken down with cautery maintaining hemostasis.  I proceeded to reduce the hernia contents fully.  Once the hernia was cleared away, a Bard Ventralight 15.2cm  mesh was inserted into the abdomen.  The mesh was secured circumferentially with am Securestrap tacker in a double crown fashion.  2-0 prolenes were  used at 3:00, 6:00, 9:00, and 12:00 as transfascial sutures using a endoclose decive.    The omentum was brought over the area of the mesh. The pneumoperitoneum was evacuated  & all trocars  were removed. The skin was reapproximated with 4-0  Monocryl sutures in a subcuticular fashion. The skin was dressed with Steri-Strips tape and gauze.  The patient was taken to the recovery room in stable condition.   PLAN OF CARE: Discharge to home after PACU  PATIENT DISPOSITION:  PACU - hemodynamically stable.   Delay start of Pharmacological VTE agent (>24hrs) due to surgical blood loss or risk of bleeding: not applicable

## 2015-07-13 NOTE — Anesthesia Postprocedure Evaluation (Signed)
  Anesthesia Post-op Note  Patient: Andrew Delgado  Procedure(s) Performed: Procedure(s): LAPAROSCOPIC UMBILICAL HERNIA REPAIR WITH MESH  X 2  (N/A) INSERTION OF MESH (N/A)  Patient Location: PACU  Anesthesia Type:General  Level of Consciousness: awake and alert   Airway and Oxygen Therapy: Patient Spontanous Breathing  Post-op Pain: Controlled  Post-op Assessment: Post-op Vital signs reviewed, Patient's Cardiovascular Status Stable and Respiratory Function Stable  Post-op Vital Signs: Reviewed  Filed Vitals:   07/13/15 0945  BP: 130/95  Pulse: 85  Temp:   Resp: 20    Complications: No apparent anesthesia complications

## 2015-07-13 NOTE — Discharge Instructions (Signed)
[facility] _______[facility] ° °UMBILICAL HERNIA REPAIR: POST OP INSTRUCTIONS ° °Always review your discharge instruction sheet given to you by the facility where your surgery was performed. °IF YOU HAVE DISABILITY OR FAMILY LEAVE FORMS, YOU MUST BRING THEM TO THE OFFICE FOR PROCESSING.   °DO NOT GIVE THEM TO YOUR DOCTOR. ° °1. A  prescription for pain medication may be given to you upon discharge.  Take your pain medication as prescribed, if needed.  If narcotic pain medicine is not needed, then you may take acetaminophen (Tylenol) or ibuprofen (Advil) as needed. °2. Take your usually prescribed medications unless otherwise directed. °3. If you need a refill on your pain medication, please contact your pharmacy.  They will contact our office to request authorization. Prescriptions will not be filled after 5 pm or on week-ends. °4. You should follow a light diet the first 24 hours after arrival home, such as soup and crackers, etc.  Be sure to include lots of fluids daily.  Resume your normal diet the day after surgery. °5. Most patients will experience some swelling and bruising around the umbilicus or in the groin and scrotum.  Ice packs and reclining will help.  Swelling and bruising can take several days to resolve.  °6. It is common to experience some constipation if taking pain medication after surgery.  Increasing fluid intake and taking a stool softener (such as Colace) will usually help or prevent this problem from occurring.  A mild laxative (Milk of Magnesia or Miralax) should be taken according to package directions if there are no bowel movements after 48 hours. °7. Unless discharge instructions indicate otherwise, you may remove your bandages 24-48 hours after surgery, and you may shower at that time.  You may have steri-strips (small skin tapes) in place directly over the incision.  These strips should be left on the skin for 7-10 days.  If your surgeon used skin glue on the incision, you  may shower in 24 hours.  The glue will flake off over the next 2-3 weeks.  Any sutures or staples will be removed at the office during your follow-up visit. °8. ACTIVITIES:  You may resume regular (light) daily activities beginning the next day--such as daily self-care, walking, climbing stairs--gradually increasing activities as tolerated.  You may have sexual intercourse when it is comfortable.  Refrain from any heavy lifting or straining until approved by your doctor. °a. You may drive when you are no longer taking prescription pain medication, you can comfortably wear a seatbelt, and you can safely maneuver your car and apply brakes. °b. RETURN TO WORK:  __________________________________________________________ °9. You should see your doctor in the office for a follow-up appointment approximately 2-3 weeks after your surgery.  Make sure that you call for this appointment within a day or two after you arrive home to insure a convenient appointment time. °10. OTHER INSTRUCTIONS:  __________________________________________________________________________________________________________________________________________________________________________________________  °WHEN TO CALL YOUR DOCTOR: °1. Fever over 101.0 °2. Inability to urinate °3. Nausea and/or vomiting °4. Extreme swelling or bruising °5. Continued bleeding from incision. °6. Increased pain, redness, or drainage from the incision ° °The clinic staff is available to answer your questions during regular business hours.  Please don’t hesitate to call and ask to speak to one of the nurses for clinical concerns.  If you have a medical emergency, go to the nearest emergency room or call 911.  A surgeon from [facility] is always on call at the hospital ° ° °[address], [address], [place], [place]  [address] ? ° [address] [address], [place], [place]   [address] °[phone] ? [phone] ? FAX [phone] °Web site:  [email or web address] ° °

## 2015-07-13 NOTE — Anesthesia Preprocedure Evaluation (Addendum)
Anesthesia Evaluation  Patient identified by MRN, date of birth, ID band Patient awake    Reviewed: Allergy & Precautions, H&P , NPO status , Patient's Chart, lab work & pertinent test results  History of Anesthesia Complications (+) PONV  Airway Mallampati: III  TM Distance: >3 FB Neck ROM: Full    Dental no notable dental hx. (+) Teeth Intact, Dental Advisory Given   Pulmonary asthma , former smoker,  breath sounds clear to auscultation  Pulmonary exam normal       Cardiovascular hypertension, Pt. on medications Rhythm:Regular Rate:Normal     Neuro/Psych  Headaches, negative psych ROS   GI/Hepatic negative GI ROS, Neg liver ROS,   Endo/Other  negative endocrine ROS  Renal/GU negative Renal ROS  negative genitourinary   Musculoskeletal   Abdominal   Peds  Hematology negative hematology ROS (+)   Anesthesia Other Findings   Reproductive/Obstetrics negative OB ROS                            Anesthesia Physical Anesthesia Plan  ASA: II  Anesthesia Plan: General   Post-op Pain Management:    Induction: Intravenous  Airway Management Planned: Oral ETT  Additional Equipment:   Intra-op Plan:   Post-operative Plan: Extubation in OR  Informed Consent: I have reviewed the patients History and Physical, chart, labs and discussed the procedure including the risks, benefits and alternatives for the proposed anesthesia with the patient or authorized representative who has indicated his/her understanding and acceptance.   Dental advisory given  Plan Discussed with: CRNA  Anesthesia Plan Comments:         Anesthesia Quick Evaluation

## 2015-07-13 NOTE — Anesthesia Procedure Notes (Signed)
Procedure Name: Intubation Date/Time: 07/13/2015 7:37 AM Performed by: Patrcia Dolly, Lynwood Kubisiak Pre-anesthesia Checklist: Patient identified, Patient being monitored, Timeout performed, Emergency Drugs available and Suction available Patient Re-evaluated:Patient Re-evaluated prior to inductionOxygen Delivery Method: Circle System Utilized Preoxygenation: Pre-oxygenation with 100% oxygen Intubation Type: IV induction Ventilation: Mask ventilation without difficulty Laryngoscope Size: Mac and 3 Grade View: Grade I Tube type: Oral Tube size: 7.0 mm Number of attempts: 1 Airway Equipment and Method: Stylet Placement Confirmation: ETT inserted through vocal cords under direct vision,  positive ETCO2 and breath sounds checked- equal and bilateral Secured at: 21 cm Tube secured with: Tape Dental Injury: Teeth and Oropharynx as per pre-operative assessment

## 2015-07-16 ENCOUNTER — Encounter (HOSPITAL_COMMUNITY): Payer: Self-pay | Admitting: General Surgery

## 2016-12-08 DIAGNOSIS — H524 Presbyopia: Secondary | ICD-10-CM | POA: Diagnosis not present

## 2016-12-18 DIAGNOSIS — J309 Allergic rhinitis, unspecified: Secondary | ICD-10-CM | POA: Diagnosis not present

## 2016-12-18 DIAGNOSIS — G4700 Insomnia, unspecified: Secondary | ICD-10-CM | POA: Diagnosis not present

## 2017-02-24 DIAGNOSIS — J209 Acute bronchitis, unspecified: Secondary | ICD-10-CM | POA: Diagnosis not present

## 2017-02-24 DIAGNOSIS — J300 Vasomotor rhinitis: Secondary | ICD-10-CM | POA: Diagnosis not present

## 2017-02-24 DIAGNOSIS — J4540 Moderate persistent asthma, uncomplicated: Secondary | ICD-10-CM | POA: Diagnosis not present

## 2017-05-08 DIAGNOSIS — M79672 Pain in left foot: Secondary | ICD-10-CM | POA: Diagnosis not present

## 2017-06-04 DIAGNOSIS — Z0000 Encounter for general adult medical examination without abnormal findings: Secondary | ICD-10-CM | POA: Diagnosis not present

## 2017-06-04 DIAGNOSIS — Z23 Encounter for immunization: Secondary | ICD-10-CM | POA: Diagnosis not present

## 2017-06-23 DIAGNOSIS — L040 Acute lymphadenitis of face, head and neck: Secondary | ICD-10-CM | POA: Diagnosis not present

## 2017-06-23 DIAGNOSIS — J029 Acute pharyngitis, unspecified: Secondary | ICD-10-CM | POA: Diagnosis not present

## 2017-07-21 ENCOUNTER — Ambulatory Visit: Payer: 59 | Admitting: Allergy and Immunology

## 2017-09-01 ENCOUNTER — Ambulatory Visit: Payer: 59 | Admitting: Allergy and Immunology

## 2017-10-21 ENCOUNTER — Ambulatory Visit: Payer: 59 | Admitting: Allergy and Immunology

## 2017-11-07 DIAGNOSIS — J209 Acute bronchitis, unspecified: Secondary | ICD-10-CM | POA: Diagnosis not present

## 2017-12-07 DIAGNOSIS — R202 Paresthesia of skin: Secondary | ICD-10-CM | POA: Diagnosis not present

## 2017-12-07 DIAGNOSIS — G4700 Insomnia, unspecified: Secondary | ICD-10-CM | POA: Diagnosis not present

## 2017-12-12 DIAGNOSIS — J029 Acute pharyngitis, unspecified: Secondary | ICD-10-CM | POA: Diagnosis not present

## 2017-12-12 DIAGNOSIS — J019 Acute sinusitis, unspecified: Secondary | ICD-10-CM | POA: Diagnosis not present

## 2017-12-13 ENCOUNTER — Other Ambulatory Visit: Payer: Self-pay

## 2017-12-13 ENCOUNTER — Emergency Department (HOSPITAL_BASED_OUTPATIENT_CLINIC_OR_DEPARTMENT_OTHER)
Admission: EM | Admit: 2017-12-13 | Discharge: 2017-12-13 | Discharge status: Against Medical Advice | Payer: 59 | Attending: Emergency Medicine | Admitting: Emergency Medicine

## 2017-12-13 ENCOUNTER — Encounter (HOSPITAL_BASED_OUTPATIENT_CLINIC_OR_DEPARTMENT_OTHER): Payer: Self-pay | Admitting: Emergency Medicine

## 2017-12-13 DIAGNOSIS — M79661 Pain in right lower leg: Secondary | ICD-10-CM | POA: Diagnosis not present

## 2017-12-13 DIAGNOSIS — Z5321 Procedure and treatment not carried out due to patient leaving prior to being seen by health care provider: Secondary | ICD-10-CM | POA: Insufficient documentation

## 2017-12-13 NOTE — ED Triage Notes (Signed)
Pt c/o RLE pain (calf) intermittently x couple weeks.

## 2017-12-13 NOTE — ED Notes (Signed)
US not available as of 6pm per staff; EDP notified and sts we could give pt anti-platelet med (1st dose) and do US tomorrow. Pt was provided this information and is choosing to leave; he will f/up with PCP in the morning and return here if needed.

## 2017-12-14 DIAGNOSIS — M79661 Pain in right lower leg: Secondary | ICD-10-CM | POA: Diagnosis not present

## 2017-12-31 DIAGNOSIS — J4540 Moderate persistent asthma, uncomplicated: Secondary | ICD-10-CM | POA: Diagnosis not present

## 2017-12-31 DIAGNOSIS — J3089 Other allergic rhinitis: Secondary | ICD-10-CM | POA: Diagnosis not present

## 2017-12-31 DIAGNOSIS — J301 Allergic rhinitis due to pollen: Secondary | ICD-10-CM | POA: Diagnosis not present

## 2018-01-06 DIAGNOSIS — J301 Allergic rhinitis due to pollen: Secondary | ICD-10-CM | POA: Diagnosis not present

## 2018-01-07 DIAGNOSIS — J3089 Other allergic rhinitis: Secondary | ICD-10-CM | POA: Diagnosis not present

## 2018-01-13 DIAGNOSIS — Z8249 Family history of ischemic heart disease and other diseases of the circulatory system: Secondary | ICD-10-CM | POA: Diagnosis not present

## 2018-01-13 DIAGNOSIS — G4452 New daily persistent headache (NDPH): Secondary | ICD-10-CM | POA: Diagnosis not present

## 2018-01-15 ENCOUNTER — Other Ambulatory Visit: Payer: Self-pay | Admitting: Family Medicine

## 2018-01-15 DIAGNOSIS — Z8249 Family history of ischemic heart disease and other diseases of the circulatory system: Secondary | ICD-10-CM

## 2018-01-15 DIAGNOSIS — G4452 New daily persistent headache (NDPH): Secondary | ICD-10-CM

## 2018-01-17 ENCOUNTER — Other Ambulatory Visit: Payer: 59

## 2018-01-20 DIAGNOSIS — R51 Headache: Secondary | ICD-10-CM | POA: Diagnosis not present

## 2018-01-20 DIAGNOSIS — R42 Dizziness and giddiness: Secondary | ICD-10-CM | POA: Diagnosis not present

## 2018-01-25 ENCOUNTER — Ambulatory Visit
Admission: RE | Admit: 2018-01-25 | Discharge: 2018-01-25 | Disposition: A | Discharge status: Home / Self Care | Payer: 59 | Source: Ambulatory Visit | Attending: Family Medicine | Admitting: Family Medicine

## 2018-01-25 DIAGNOSIS — Z8249 Family history of ischemic heart disease and other diseases of the circulatory system: Secondary | ICD-10-CM

## 2018-01-25 DIAGNOSIS — R42 Dizziness and giddiness: Secondary | ICD-10-CM | POA: Diagnosis not present

## 2018-01-25 DIAGNOSIS — G4452 New daily persistent headache (NDPH): Secondary | ICD-10-CM

## 2018-01-27 ENCOUNTER — Encounter: Payer: Self-pay | Admitting: Neurology

## 2018-03-16 ENCOUNTER — Encounter

## 2018-03-16 ENCOUNTER — Ambulatory Visit: Payer: 59 | Admitting: Neurology

## 2018-03-16 ENCOUNTER — Encounter: Payer: Self-pay | Admitting: Neurology

## 2018-03-16 VITALS — BP 134/68 | HR 91 | Ht 68.5 in | Wt 173.2 lb

## 2018-03-16 DIAGNOSIS — R42 Dizziness and giddiness: Secondary | ICD-10-CM

## 2018-03-16 NOTE — Progress Notes (Signed)
Pt requested to have vestibular rehab at [facility]. Called to confirm they have vestibular rehab, they do and therapist's name is Ronne Binning, fax order to [phone]

## 2018-03-16 NOTE — Progress Notes (Signed)
NEUROLOGY CONSULTATION NOTE  Andrew Delgado MRN: [id number] DOB: 09/06/1976  Referring provider: Dr. Tenny Craw Primary care provider: Dr. Tenny Craw  Reason for consult:  dizziness  HISTORY OF PRESENT ILLNESS: Andrew Delgado is a 42 year old male who presents for dizziness.  History supplemented by PCP note.  In February, he was sitting at a conference when he suddenly felt dizzy.  It is a sensation of movement.  There may be some associated mild nausea and slight pressure behind the right eye.  He didn't have any preceding event such as viral illness or head injury.  A few days later, he had severe episode lasting an entire day with spinning and nausea but no vomiting, in which he was incapacitated.  At some point, he gets dizzy almost daily.  It occurs spontaneously but it may be triggered with quick head movement or gaze to the left, walking down the aile at the grocery store or at night while driving and headlights are speeding by him.  Other than the slight pressure, there is no headache.  He notes some mild worsening vision in the right eye but hasn't followed up with the eye doctor.  He denies hearing loss, tinnitus or unilateral numbness or weakness.  MRI of brain without contrast performed on 01/25/18 was personally reviewed and was normal.  He reports past history of some headaches but nothing significant or anything that he would classify as migraine.    PAST MEDICAL HISTORY: Past Medical History:  Diagnosis Date  . Asthma    as a child  . Family history of adverse reaction to anesthesia    it takes my mother a long time to come out of anesthesia and PONV  . Headache   . Hypertension   . Hypoglycemic reaction    history"hypoglycemic"  . Pneumonia    Pneumonia- severe case 4'16 (multiple rounds oral antibiotics).Using Inhaler daily.Occ. residual cough.  Marland Kitchen PONV (postoperative nausea and vomiting)   . Umbilical hernia   . Wears glasses     PAST SURGICAL HISTORY: Past  Surgical History:  Procedure Laterality Date  . ADENOIDECTOMY    . CIRCUMCISION    . INSERTION OF MESH N/A 07/13/2015   Procedure: INSERTION OF MESH;  Surgeon: Axel Filler, MD;  Location: [facility] OR;  Service: General;  Laterality: N/A;  . MYRINGOTOMY WITH TUBE PLACEMENT     x2 - 30 yrs ago  . UMBILICAL HERNIA REPAIR N/A 07/13/2015   Procedure: LAPAROSCOPIC UMBILICAL HERNIA REPAIR WITH MESH  X 2 ;  Surgeon: Axel Filler, MD;  Location: [facility] OR;  Service: General;  Laterality: N/A;  . WISDOM TOOTH EXTRACTION      MEDICATIONS: Current Outpatient Medications on File Prior to Visit  Medication Sig Dispense Refill  . fexofenadine (ALLEGRA) 180 MG tablet Take 180 mg by mouth daily.    . Aspirin-Acetaminophen-Caffeine (GOODY HEADACHE PO) Take 1 tablet by mouth daily as needed (headache).    . budesonide-formoterol (SYMBICORT) 80-4.5 MCG/ACT inhaler Inhale 2 puffs into the lungs 2 (two) times daily. 1 Inhaler 11  . cetirizine (ZYRTEC ALLERGY) 10 MG tablet Take 10 mg by mouth daily.    Marland Kitchen dextromethorphan-guaiFENesin (MUCINEX DM) 30-600 MG per 12 hr tablet Take 1 tablet by mouth 2 (two) times daily as needed for cough.    . fluticasone (FLONASE) 50 MCG/ACT nasal spray Place 2 sprays into both nostrils daily.    Marland Kitchen losartan (COZAAR) 50 MG tablet Take 50 mg by mouth daily.    Marland Kitchen  Multiple Vitamin (MULTIVITAMIN WITH MINERALS) TABS tablet Take 1 tablet by mouth daily.    Marland Kitchen oxyCODONE-acetaminophen (ROXICET) 5-325 MG per tablet Take 1-2 tablets by mouth every 4 (four) hours as needed. (Patient not taking: Reported on 03/16/2018) 30 tablet 0  . traZODone (DESYREL) 150 MG tablet Take 150 mg by mouth at bedtime.    Marland Kitchen zolpidem (AMBIEN) 10 MG tablet Take 10 mg by mouth at bedtime as needed for sleep.     No current facility-administered medications on file prior to visit.     ALLERGIES: Allergies  Allergen Reactions  . Chantix [Varenicline]     FAMILY HISTORY: Family History  Problem Relation Age of  Onset  . Allergies Sister   . Heart disease Mother   . Anuerysm Father     SOCIAL HISTORY: Social History   Socioeconomic History  . Marital status: Married    Spouse name: Belenda Cruise  . Number of children: 5  . Years of education: Not on file  . Highest education level: Some college, no degree  Occupational History  . Occupation: Emergency planning/management officer  Social Needs  . Financial resource strain: Not on file  . Food insecurity:    Worry: Not on file    Inability: Not on file  . Transportation needs:    Medical: Not on file    Non-medical: Not on file  Tobacco Use  . Smoking status: Former Smoker    Packs/day: 1.00    Years: 20.00    Pack years: 20.00    Types: Cigarettes    Last attempt to quit: 11/17/2010    Years since quitting: 7.3  . Smokeless tobacco: Former Neurosurgeon    Types: Chew  Substance and Sexual Activity  . Alcohol use: Yes    Alcohol/week: 0.0 oz    Comment: occ.  . Drug use: No  . Sexual activity: Not on file  Lifestyle  . Physical activity:    Days per week: Not on file    Minutes per session: Not on file  . Stress: Not on file  Relationships  . Social connections:    Talks on phone: Not on file    Gets together: Not on file    Attends religious service: Not on file    Active member of club or organization: Not on file    Attends meetings of clubs or organizations: Not on file    Relationship status: Not on file  . Intimate partner violence:    Fear of current or ex partner: Not on file    Emotionally abused: Not on file    Physically abused: Not on file    Forced sexual activity: Not on file  Other Topics Concern  . Not on file  Social History Narrative   Pt right handed, he is married, lives with wife and 5 sons in a 2 story house. He drinks 1-2 cups of coffee on the weekends. He runs and bicycles 3-4 x a week.    REVIEW OF SYSTEMS: Constitutional: No fevers, chills, or sweats, no generalized fatigue, change in appetite Eyes: No visual changes,  double vision, eye pain Ear, nose and throat: No hearing loss, ear pain, nasal congestion, sore throat Cardiovascular: No chest pain, palpitations Respiratory:  No shortness of breath at rest or with exertion, wheezes GastrointestinaI: No nausea, vomiting, diarrhea, abdominal pain, fecal incontinence Genitourinary:  No dysuria, urinary retention or frequency Musculoskeletal:  No neck pain, back pain Integumentary: No rash, pruritus, skin lesions Neurological: as above Psychiatric: No  depression, insomnia, anxiety Endocrine: No palpitations, fatigue, diaphoresis, mood swings, change in appetite, change in weight, increased thirst Hematologic/Lymphatic:  No purpura, petechiae. Allergic/Immunologic: no itchy/runny eyes, nasal congestion, recent allergic reactions, rashes  PHYSICAL EXAM: Vitals:   03/16/18 1455  BP: 134/68  Pulse: 91  SpO2: 99%   General: No acute distress.  Patient appears well-groomed.  Head:  Normocephalic/atraumatic Eyes:  fundi examined but not visualized Neck: supple, no paraspinal tenderness, full range of motion Back: No paraspinal tenderness Heart: regular rate and rhythm Lungs: Clear to auscultation bilaterally. Vascular: No carotid bruits. Neurological Exam: Mental status: alert and oriented to person, place, and time, recent and remote memory intact, fund of knowledge intact, attention and concentration intact, speech fluent and not dysarthric, language intact. Cranial nerves: CN I: not tested CN II: pupils equal, round and reactive to light, visual fields intact CN Delgado, IV, VI:  full range of motion, no nystagmus, no ptosis CN V: facial sensation intact CN VII: upper and lower face symmetric CN VIII: hearing intact CN IX, X: gag intact, uvula midline CN XI: sternocleidomastoid and trapezius muscles intact CN XII: tongue midline Bulk & Tone: normal, no fasciculations. Motor:  5/5 throughout  Sensation: temperature and vibration sensation  intact. Deep Tendon Reflexes:  2+ throughout, toes downgoing.  Finger to nose testing:  Without dysmetria.  Heel to shin:  Without dysmetria.  Gait:  Normal station and stride.  Able to turn and tandem walk. Romberg negative.  IMPRESSION: Vertigo.  Unclear classification.  It is not classic BPPV.  It is not consistent with vestibular neuritis.  He does not have any significant headache or lateralizing symptoms on exam.  Although not a typical presentation, there may be a migraine component to it.  MRI does not demonstrate any explainable intracranial structural lesion.  PLAN: We will first send him to vestibular rehabilitation.  If PT is ineffective or if the therapist doesn't think it would be helpful, he is to contact me and we can start a medication such as an antidepressant or anti-epileptic medication.  Thank you for allowing me to take part in the care of this patient.  Shon Millet, DO  CC: C. Duane Lope, MD

## 2018-03-16 NOTE — Patient Instructions (Addendum)
1.  We will first send you to vestibular rehabilitation.  If therapy doesn't work or if the physical therapist doesn't think they can help you, contact me and I will prescribe you a medication that may be helpful (either an antidepressant or anti-seizure medication).  I have sent the referral to [facility] as you requested. They will contact you directly to schedule. If you wish to call them, their telephone number is: [phone]

## 2018-03-24 DIAGNOSIS — H81399 Other peripheral vertigo, unspecified ear: Secondary | ICD-10-CM | POA: Diagnosis not present

## 2018-03-24 DIAGNOSIS — R42 Dizziness and giddiness: Secondary | ICD-10-CM | POA: Diagnosis not present

## 2018-04-14 DIAGNOSIS — R42 Dizziness and giddiness: Secondary | ICD-10-CM | POA: Diagnosis not present

## 2018-04-14 DIAGNOSIS — H81399 Other peripheral vertigo, unspecified ear: Secondary | ICD-10-CM | POA: Diagnosis not present

## 2018-04-16 DIAGNOSIS — R42 Dizziness and giddiness: Secondary | ICD-10-CM | POA: Diagnosis not present

## 2018-04-16 DIAGNOSIS — H81399 Other peripheral vertigo, unspecified ear: Secondary | ICD-10-CM | POA: Diagnosis not present

## 2018-04-29 DIAGNOSIS — H81399 Other peripheral vertigo, unspecified ear: Secondary | ICD-10-CM | POA: Diagnosis not present

## 2018-04-29 DIAGNOSIS — R42 Dizziness and giddiness: Secondary | ICD-10-CM | POA: Diagnosis not present

## 2018-05-14 DIAGNOSIS — H81399 Other peripheral vertigo, unspecified ear: Secondary | ICD-10-CM | POA: Diagnosis not present

## 2018-05-14 DIAGNOSIS — R42 Dizziness and giddiness: Secondary | ICD-10-CM | POA: Diagnosis not present

## 2018-06-11 DIAGNOSIS — H81399 Other peripheral vertigo, unspecified ear: Secondary | ICD-10-CM | POA: Diagnosis not present

## 2018-07-02 DIAGNOSIS — Z0000 Encounter for general adult medical examination without abnormal findings: Secondary | ICD-10-CM | POA: Diagnosis not present

## 2018-07-06 DIAGNOSIS — Z0000 Encounter for general adult medical examination without abnormal findings: Secondary | ICD-10-CM | POA: Diagnosis not present

## 2018-08-05 DIAGNOSIS — J069 Acute upper respiratory infection, unspecified: Secondary | ICD-10-CM | POA: Diagnosis not present

## 2018-08-05 DIAGNOSIS — J019 Acute sinusitis, unspecified: Secondary | ICD-10-CM | POA: Diagnosis not present

## 2018-10-08 DIAGNOSIS — L03115 Cellulitis of right lower limb: Secondary | ICD-10-CM | POA: Diagnosis not present

## 2018-10-19 DIAGNOSIS — J188 Other pneumonia, unspecified organism: Secondary | ICD-10-CM | POA: Diagnosis not present

## 2018-10-19 DIAGNOSIS — R042 Hemoptysis: Secondary | ICD-10-CM | POA: Diagnosis not present

## 2018-10-19 DIAGNOSIS — R05 Cough: Secondary | ICD-10-CM | POA: Diagnosis not present

## 2018-11-11 DIAGNOSIS — J069 Acute upper respiratory infection, unspecified: Secondary | ICD-10-CM | POA: Diagnosis not present

## 2018-11-30 ENCOUNTER — Emergency Department (HOSPITAL_BASED_OUTPATIENT_CLINIC_OR_DEPARTMENT_OTHER): Payer: 59

## 2018-11-30 ENCOUNTER — Other Ambulatory Visit: Payer: Self-pay

## 2018-11-30 ENCOUNTER — Emergency Department (HOSPITAL_BASED_OUTPATIENT_CLINIC_OR_DEPARTMENT_OTHER)
Admission: EM | Admit: 2018-11-30 | Discharge: 2018-11-30 | Disposition: A | Discharge status: Home / Self Care | Payer: 59 | Attending: Emergency Medicine | Admitting: Emergency Medicine

## 2018-11-30 ENCOUNTER — Encounter (HOSPITAL_BASED_OUTPATIENT_CLINIC_OR_DEPARTMENT_OTHER): Payer: Self-pay | Admitting: *Deleted

## 2018-11-30 DIAGNOSIS — R51 Headache: Secondary | ICD-10-CM | POA: Insufficient documentation

## 2018-11-30 DIAGNOSIS — I10 Essential (primary) hypertension: Secondary | ICD-10-CM | POA: Diagnosis not present

## 2018-11-30 DIAGNOSIS — R42 Dizziness and giddiness: Secondary | ICD-10-CM | POA: Diagnosis not present

## 2018-11-30 DIAGNOSIS — R519 Headache, unspecified: Secondary | ICD-10-CM

## 2018-11-30 DIAGNOSIS — H547 Unspecified visual loss: Secondary | ICD-10-CM | POA: Diagnosis not present

## 2018-11-30 DIAGNOSIS — J45909 Unspecified asthma, uncomplicated: Secondary | ICD-10-CM | POA: Insufficient documentation

## 2018-11-30 DIAGNOSIS — R110 Nausea: Secondary | ICD-10-CM | POA: Diagnosis not present

## 2018-11-30 DIAGNOSIS — Z79899 Other long term (current) drug therapy: Secondary | ICD-10-CM | POA: Diagnosis not present

## 2018-11-30 DIAGNOSIS — Z87891 Personal history of nicotine dependence: Secondary | ICD-10-CM | POA: Insufficient documentation

## 2018-11-30 DIAGNOSIS — J387 Other diseases of larynx: Secondary | ICD-10-CM | POA: Diagnosis not present

## 2018-11-30 DIAGNOSIS — H5461 Unqualified visual loss, right eye, normal vision left eye: Secondary | ICD-10-CM | POA: Insufficient documentation

## 2018-11-30 LAB — CBC WITH DIFFERENTIAL/PLATELET
Abs Immature Granulocytes: 0.01 10*3/uL (ref 0.00–0.07)
Basophils Absolute: 0 10*3/uL (ref 0.0–0.1)
Basophils Relative: 1 %
EOS PCT: 1 %
Eosinophils Absolute: 0.1 10*3/uL (ref 0.0–0.5)
HEMATOCRIT: 45.4 % (ref 39.0–52.0)
HEMOGLOBIN: 15.3 g/dL (ref 13.0–17.0)
Immature Granulocytes: 0 %
LYMPHS PCT: 28 %
Lymphs Abs: 1.8 10*3/uL (ref 0.7–4.0)
MCH: 33.3 pg (ref 26.0–34.0)
MCHC: 33.7 g/dL (ref 30.0–36.0)
MCV: 98.7 fL (ref 80.0–100.0)
MONO ABS: 0.6 10*3/uL (ref 0.1–1.0)
Monocytes Relative: 10 %
Neutro Abs: 3.9 10*3/uL (ref 1.7–7.7)
Neutrophils Relative %: 60 %
Platelets: 195 10*3/uL (ref 150–400)
RBC: 4.6 MIL/uL (ref 4.22–5.81)
RDW: 11.9 % (ref 11.5–15.5)
WBC: 6.4 10*3/uL (ref 4.0–10.5)
nRBC: 0 % (ref 0.0–0.2)

## 2018-11-30 LAB — COMPREHENSIVE METABOLIC PANEL
ALT: 19 U/L (ref 0–44)
ANION GAP: 7 (ref 5–15)
AST: 21 U/L (ref 15–41)
Albumin: 4.7 g/dL (ref 3.5–5.0)
Alkaline Phosphatase: 44 U/L (ref 38–126)
BUN: 10 mg/dL (ref 6–20)
CHLORIDE: 104 mmol/L (ref 98–111)
CO2: 28 mmol/L (ref 22–32)
Calcium: 9.5 mg/dL (ref 8.9–10.3)
Creatinine, Ser: 0.7 mg/dL (ref 0.61–1.24)
Glucose, Bld: 98 mg/dL (ref 70–99)
Potassium: 4.1 mmol/L (ref 3.5–5.1)
Sodium: 139 mmol/L (ref 135–145)
Total Bilirubin: 0.9 mg/dL (ref 0.3–1.2)
Total Protein: 7 g/dL (ref 6.5–8.1)

## 2018-11-30 LAB — LIPASE, BLOOD: LIPASE: 27 U/L (ref 11–51)

## 2018-11-30 LAB — C-REACTIVE PROTEIN: CRP: <0.8 mg/dL (ref <1.0)

## 2018-11-30 LAB — TSH: TSH: 0.787 u[IU]/mL (ref 0.350–4.500)

## 2018-11-30 LAB — SEDIMENTATION RATE: SED RATE: 2 mm/h (ref 0–16)

## 2018-11-30 LAB — TROPONIN I

## 2018-11-30 MED ORDER — IOPAMIDOL (ISOVUE-370) INJECTION 76%
100.0000 mL | Freq: Once | INTRAVENOUS | Status: AC | PRN
  Administered 2018-11-30: 100 mL via INTRAVENOUS

## 2018-11-30 NOTE — ED Triage Notes (Signed)
Sore spot in the right side of his head, blurred vision and dizziness x 3 days. Chest pain yesterday. States his stress level is high.

## 2018-11-30 NOTE — Discharge Instructions (Signed)
Please call your neurologist tomorrow.  Return to the ED for any worsening symptoms.

## 2018-11-30 NOTE — ED Notes (Signed)
Pt next in line for MRI  

## 2018-11-30 NOTE — ED Provider Notes (Signed)
[facility] EMERGENCY DEPARTMENT Provider Note   CSN: [id number] Arrival date & time: 11/30/18  1220     History   Chief Complaint Chief Complaint  Patient presents with  . Chest Pain  . Headache    HPI Andrew Delgado is a 43 y.o. male.  The history is provided by the patient and medical records.  Neurologic Problem  This is a recurrent problem. The current episode started more than 1 week ago. The problem occurs constantly. The problem has not changed (waxing and waning) since onset.Associated symptoms include headaches. Pertinent negatives include no chest pain, no abdominal pain and no shortness of breath. Nothing aggravates the symptoms. Nothing relieves the symptoms. He has tried nothing for the symptoms. The treatment provided no relief.    Past Medical History:  Diagnosis Date  . Asthma    as a child  . Family history of adverse reaction to anesthesia    it takes my mother a long time to come out of anesthesia and PONV  . Headache   . Hypertension   . Hypoglycemic reaction    history"hypoglycemic"  . Pneumonia    Pneumonia- severe case 4'16 (multiple rounds oral antibiotics).Using Inhaler daily.Occ. residual cough.  Marland Kitchen PONV (postoperative nausea and vomiting)   . Umbilical hernia   . Wears glasses     Patient Active Problem List   Diagnosis Date Noted  . Obesity 05/08/2015  . Cough variant asthma 05/07/2015  . Upper airway cough syndrome 05/07/2015    Past Surgical History:  Procedure Laterality Date  . ADENOIDECTOMY    . CIRCUMCISION    . INSERTION OF MESH N/A 07/13/2015   Procedure: INSERTION OF MESH;  Surgeon: Ralene Ok, MD;  Location: [facility];  Service: General;  Laterality: N/A;  . MYRINGOTOMY WITH TUBE PLACEMENT     x2 - 30 yrs ago  . UMBILICAL HERNIA REPAIR N/A 07/13/2015   Procedure: LAPAROSCOPIC UMBILICAL HERNIA REPAIR WITH MESH  X 2 ;  Surgeon: Ralene Ok, MD;  Location: [facility];  Service: General;  Laterality: N/A;  .  WISDOM TOOTH EXTRACTION          Home Medications    Prior to Admission medications   Medication Sig Start Date End Date Taking? Authorizing Provider  budesonide-formoterol (SYMBICORT) 80-4.5 MCG/ACT inhaler Inhale 2 puffs into the lungs 2 (two) times daily. 05/07/15  Yes Tanda Rockers, MD  fexofenadine (ALLEGRA) 180 MG tablet Take 180 mg by mouth daily.   Yes [provider]  fluticasone (FLONASE) 50 MCG/ACT nasal spray Place 2 sprays into both nostrils daily.   Yes [provider]  Multiple Vitamin (MULTIVITAMIN WITH MINERALS) TABS tablet Take 1 tablet by mouth daily.   Yes [provider]  traZODone (DESYREL) 150 MG tablet Take 150 mg by mouth at bedtime.   Yes [provider]  Aspirin-Acetaminophen-Caffeine (GOODY HEADACHE PO) Take 1 tablet by mouth daily as needed (headache).    [provider]  cetirizine (ZYRTEC ALLERGY) 10 MG tablet Take 10 mg by mouth daily.    [provider]  dextromethorphan-guaiFENesin (MUCINEX DM) 30-600 MG per 12 hr tablet Take 1 tablet by mouth 2 (two) times daily as needed for cough.    [provider]  losartan (COZAAR) 50 MG tablet Take 50 mg by mouth daily.    [provider]  oxyCODONE-acetaminophen (ROXICET) 5-325 MG per tablet Take 1-2 tablets by mouth every 4 (four) hours as needed. Patient not taking: Reported on  03/16/2018 07/13/15   Ralene Ok, MD  zolpidem (AMBIEN) 10 MG tablet Take 10 mg by mouth at bedtime as needed for sleep.    [provider]    Family History Family History  Problem Relation Age of Onset  . Allergies Sister   . Heart disease Mother   . Anuerysm Father     Social History Social History   Tobacco Use  . Smoking status: Former Smoker    Packs/day: 1.00    Years: 20.00    Pack years: 20.00    Types: Cigarettes    Last attempt to quit: 11/17/2010    Years since quitting: 8.0  . Smokeless tobacco: Former Systems developer    Types: Chew    Substance Use Topics  . Alcohol use: Yes    Alcohol/week: 0.0 standard drinks    Comment: occ.  . Drug use: No     Allergies   Chantix [varenicline]   Review of Systems Review of Systems  Constitutional: Negative for chills, diaphoresis, fatigue and fever.  HENT: Negative for congestion and rhinorrhea.   Eyes: Positive for visual disturbance. Negative for photophobia.       More prominent vessel on R lateral eye.   Respiratory: Negative for shortness of breath.   Cardiovascular: Negative for chest pain.  Gastrointestinal: Positive for nausea. Negative for abdominal pain, constipation, diarrhea and vomiting.  Genitourinary: Negative for flank pain and frequency.  Musculoskeletal: Negative for back pain, neck pain and neck stiffness.  Skin: Negative for rash and wound.  Neurological: Positive for dizziness and headaches. Negative for seizures, facial asymmetry, weakness, light-headedness and numbness.  Psychiatric/Behavioral: Negative for agitation and confusion.  All other systems reviewed and are negative.    Physical Exam Updated Vital Signs BP (!) 174/111 (BP Location: Left Arm)   Pulse (!) 103   Temp 99.1 F (37.3 C) (Oral)   Resp 20   Ht _0  (1.727 m)   Wt 77.1 kg   SpO2 100%   BMI 25.85 kg/m   Physical Exam Vitals signs and nursing note reviewed.  Constitutional:      General: He is not in acute distress.    Appearance: He is well-developed. He is not ill-appearing, toxic-appearing or diaphoretic.  HENT:     Head: Atraumatic.      Comments: Tenderness in right temporal area.  No numbness or tingling.  No facial droop.    Mouth/Throat:     Mouth: Mucous membranes are moist.  Eyes:     General: No visual field deficit.    Extraocular Movements: Extraocular movements intact.     Right eye: Normal extraocular motion and no nystagmus.     Left eye: Normal extraocular motion and no nystagmus.     Conjunctiva/sclera: Conjunctivae normal.     Pupils:  Pupils are equal, round, and reactive to light.     Right eye: Pupil is round and reactive.     Left eye: Pupil is round and reactive.     Visual Fields: Right eye visual fields normal and left eye visual fields normal.   Neck:     Musculoskeletal: Normal range of motion and neck supple.  Cardiovascular:     Rate and Rhythm: Normal rate and regular rhythm.     Heart sounds: No murmur.  Pulmonary:     Effort: Pulmonary effort is normal. No respiratory distress.     Breath sounds: Normal breath sounds. No decreased breath sounds, wheezing, rhonchi or rales.  Abdominal:  Palpations: Abdomen is soft.     Tenderness: There is no abdominal tenderness.  Musculoskeletal:        General: No swelling or tenderness.  Skin:    General: Skin is warm and dry.  Neurological:     General: No focal deficit present.     Mental Status: He is alert.     GCS: GCS eye subscore is 4. GCS verbal subscore is 5. GCS motor subscore is 6.     Cranial Nerves: No cranial nerve deficit, dysarthria or facial asymmetry.     Sensory: Sensation is intact. No sensory deficit.     Motor: No weakness, tremor, abnormal muscle tone or seizure activity.     Coordination: Romberg sign negative. Coordination normal.     Gait: Gait is intact. Gait normal.     Comments: Normal gait, normal finger-nose-finger testing.  Normal sensation strength in all extremities.  No facial droop.  Clear speech.  Patient had normal visual fields.  Patient reported right eye blurry vision when turning his head to the left exacerbating.      ED Treatments / Results  Labs (all labs ordered are listed, but only abnormal results are displayed) Labs Reviewed  CBC WITH DIFFERENTIAL/PLATELET  COMPREHENSIVE METABOLIC PANEL  LIPASE, BLOOD  TSH  SEDIMENTATION RATE  C-REACTIVE PROTEIN  ANTINUCLEAR ANTIBODIES, IFA  TROPONIN I    EKG EKG Interpretation  Date/Time:  Tuesday November 30 2018 12:56:20 EST Ventricular Rate:  95 PR  Interval:  144 QRS Duration: 82 QT Interval:  332 QTC Calculation: 417 R Axis:   61 Text Interpretation:  Normal sinus rhythm with sinus arrhythmia Normal ECG No prior ECG for comparison,.  No STEMI Confirmed by Antony Blackbird [phone]) on 11/30/2018 2:14:39 PM   Radiology No results found.  Procedures Procedures (including critical care time)  Medications Ordered in ED Medications - No data to display   Initial Impression / Assessment and Plan / ED Course  I have reviewed the triage vital signs and the nursing notes.  Pertinent labs & imaging results that were available during my care of the patient were reviewed by me and considered in my medical decision making (see chart for details).     Andrew Delgado is a 43 y.o. male with a past medical history significant for asthma, hypertension, prior vertigo status post vestibular rehab who presents for 2 weeks of persistent symptoms of right eye blurry vision, waxing and waning right temporal headache, intermittent dizziness, and nausea.  Patient reports that 1 year ago he was seen for some headache and vision changes and was found to have vertigo.  They were unable to find the exact etiology as it was atypical however after doing vestibular rehab he reports he was doing much better.  He says that over the last 2 weeks he has been having symptoms that wax and wane but have been persistent.  He reports he has right eye blurry vision that is worsened with turning his head to the left.  He also reports he is having a right temporal headache that is causing him to have tenderness in his right temple area.  He reports that his blood pressure has been more elevated and he was decreased off of his home blood pressure medicines when he lost a significant amount of weight.  He reports that he has had some nausea but no vomiting.  He denies any speech difficulties, disorientation, or problems in his arms or legs.  He denies any coordination  difficulties with his gait.  He has had no syncopal episodes.  He reports that he had MRI that was reassuring last year and is scheduled see his ophthalmologist tomorrow for the worsening vision loss however in the setting of the headache and worsened with his head turning to the left, he wanted to be evaluated.  On exam, patient has normal peripheral vision in both eyes.  Subjectively patient reports blurry vision in the right eye.  Patient is normal extraocular movements.  No sensation normalities in face, arms, or legs.  Normal coordination with finger-nose-finger.  Romberg is negative.  Normal gait.  Normal strength in all extremities.  No facial droop.  Patient reports his vision worsens when he turns his head to the left.  Patient has tenderness on his right temple area.  Given patient's age of 75 I have extremely low suspicion that patient has giant cell arteritis causing his right temporal headache and his vision changes.  With his worsening of symptoms turning his head to the left, I am concerned about a vascular etiology of his symptoms.  Complicated migraine also considered as well as other forms of vasculitis.  Stroke considered given the persistent vision changes.  Given lack of neck pain or neck stiffness, low suspicion for meningitis as cause of headache and unilateral vision symptoms.  We will call neurology to discuss appropriate work-up.  3:25 PM Spoke with neurology who helped craft a work-up.  Neurology recommends CTA of the head and neck to be obtained at this facility.  He also recommended labs including ESR, CRP, and ANA to look for vasculitis as cause of symptoms.  He recommended MRI brain without which patient will need to be transferred to [facility] emergency department to get the MRI tonight.  After work-up, anticipate speaking with neurology to determine further disposition based on work-up results.  Based on patient's symptoms lasting for greater than the last week, and  his overall well appearance, we feel he is likely stable for personal vehicle transfer to the [facility] emergency department after his CT imaging is completed assuming no significant abnormalities are discovered.  Care transferred to Dr. Tamera Punt while awaiting diagnostic work-up to be completed.  Patient will eventually require transfer to [facility] to continue MRI work-up.   Final Clinical Impressions(s) / ED Diagnoses   Final diagnoses:  Vision loss of right eye  Dizziness  Right sided temporal headache  Nausea    ED Discharge Orders    None     Clinical Impression: 1. Vision loss of right eye   2. Dizziness   3. Right sided temporal headache   4. Nausea     Disposition: Awaiting results of further work-up and transfer to [facility] for MRI.  Anticipate discharge if work-up is completely reassuring to follow-up with outpatient neurology team.  This note was prepared with assistance of Dragon voice recognition software. Occasional wrong-word or sound-a-like substitutions may have occurred due to the inherent limitations of voice recognition software.     Jes Costales, Gwenyth Allegra, MD 11/30/18 1940

## 2018-11-30 NOTE — ED Provider Notes (Signed)
Patient is a 43 year old male who was initially seen by Dr. Rush Landmark.  He presents with soreness to his right temple area with associated vision loss.  CTA shows no evidence of vascular injury.  Dr. Rush Landmark had spoke with Dr. Otelia Limes who recommends transfer to [facility] to get an MRI brain followed by neurology evaluation.  Patient is accepted by Dr. Judd Lien to the [facility], ED.  Patient is refusing ambulance transport and will be driven there by his wife with IV intact.   Rolan Bucco, MD 11/30/18 1800

## 2018-11-30 NOTE — ED Notes (Signed)
Pt requesting to follow up outpatient for an MRI. Noted to be hypertensive at discharge with hx of the same; has not been on medications for over a year. Instructed pt to follow up with PCP and neurologist for further evaluation

## 2018-11-30 NOTE — ED Notes (Signed)
Attempted to call report

## 2018-11-30 NOTE — ED Notes (Signed)
Concerned about a red line located within the R eye. Also says that on the right side of his head is where he feels the pain and pressure when he yells or stresses.

## 2018-11-30 NOTE — ED Provider Notes (Signed)
Patient arrived as transfer from [facility].  Patient with minimal headache currently.  Offered pain meds.  Awaiting MRI.    Symptoms off and on, some eye watering and nose running.  ? Cluster headaches.  The patient had mentioned to the nurse that he no longer wants to wait for MRI.  I went and had a long discussion with him at the bedside.  He felt that he did not understand the acuity to have this done immediately.  I discussed with him the risk and benefits of a missed stroke versus intracranial hemorrhage that may have been missed on CT scan and CTA.  At this point the patient feels that he can be followed up as an outpatient by his neurologist.  He plans on seeing him in the office tomorrow.  He is of sound mind and able to make his own decisions.  He continues to have a benign neurologic exam.  D/c home.  Neuro and optho follow up.      Melene Plan, DO 11/30/18 2044

## 2018-12-01 DIAGNOSIS — H2000 Unspecified acute and subacute iridocyclitis: Secondary | ICD-10-CM | POA: Diagnosis not present

## 2018-12-01 LAB — ANTINUCLEAR ANTIBODIES, IFA: ANA Ab, IFA: NEGATIVE

## 2018-12-08 DIAGNOSIS — I10 Essential (primary) hypertension: Secondary | ICD-10-CM | POA: Diagnosis not present

## 2018-12-08 DIAGNOSIS — R51 Headache: Secondary | ICD-10-CM | POA: Diagnosis not present

## 2018-12-08 DIAGNOSIS — Z09 Encounter for follow-up examination after completed treatment for conditions other than malignant neoplasm: Secondary | ICD-10-CM | POA: Diagnosis not present

## 2018-12-21 DIAGNOSIS — H2000 Unspecified acute and subacute iridocyclitis: Secondary | ICD-10-CM | POA: Diagnosis not present

## 2018-12-23 DIAGNOSIS — R1313 Dysphagia, pharyngeal phase: Secondary | ICD-10-CM | POA: Diagnosis not present

## 2018-12-23 DIAGNOSIS — H6123 Impacted cerumen, bilateral: Secondary | ICD-10-CM | POA: Diagnosis not present

## 2019-01-05 DIAGNOSIS — H3530 Unspecified macular degeneration: Secondary | ICD-10-CM | POA: Diagnosis not present

## 2019-01-06 DIAGNOSIS — I10 Essential (primary) hypertension: Secondary | ICD-10-CM | POA: Diagnosis not present

## 2019-01-06 DIAGNOSIS — G4700 Insomnia, unspecified: Secondary | ICD-10-CM | POA: Diagnosis not present

## 2019-02-07 DIAGNOSIS — J301 Allergic rhinitis due to pollen: Secondary | ICD-10-CM | POA: Diagnosis not present

## 2019-02-07 DIAGNOSIS — R05 Cough: Secondary | ICD-10-CM | POA: Diagnosis not present

## 2019-02-07 DIAGNOSIS — J209 Acute bronchitis, unspecified: Secondary | ICD-10-CM | POA: Diagnosis not present

## 2019-02-08 DIAGNOSIS — J301 Allergic rhinitis due to pollen: Secondary | ICD-10-CM | POA: Diagnosis not present

## 2019-02-08 DIAGNOSIS — J4541 Moderate persistent asthma with (acute) exacerbation: Secondary | ICD-10-CM | POA: Diagnosis not present

## 2019-02-08 DIAGNOSIS — R05 Cough: Secondary | ICD-10-CM | POA: Diagnosis not present

## 2019-03-16 DIAGNOSIS — J3089 Other allergic rhinitis: Secondary | ICD-10-CM | POA: Diagnosis not present

## 2019-03-16 DIAGNOSIS — J4540 Moderate persistent asthma, uncomplicated: Secondary | ICD-10-CM | POA: Diagnosis not present

## 2019-03-16 DIAGNOSIS — J301 Allergic rhinitis due to pollen: Secondary | ICD-10-CM | POA: Diagnosis not present

## 2019-08-22 NOTE — Progress Notes (Deleted)
Virtual Visit via Video Note The purpose of this virtual visit is to provide medical care while limiting exposure to the novel coronavirus.    Consent was obtained for video visit:  Yes.   Answered questions that patient had about telehealth interaction:  Yes.   I discussed the limitations, risks, security and privacy concerns of performing an evaluation and management service by telemedicine. I also discussed with the patient that there may be a patient responsible charge related to this service. The patient expressed understanding and agreed to proceed.  Pt location: Home Physician Location: Home Name of referring provider:  Daisy Floro, MD I connected with Andrew Delgado at patients initiation/request on 08/24/2019 at 10:50 AM EDT by video enabled telemedicine application and verified that I am speaking with the correct person using two identifiers. Pt MRN:  [id number] Pt DOB:  1976/02/27 Video Participants:  Andrew Delgado   History of Present Illness:  Andrew Delgado is a 43 year old male who follows up for dizziness and headache  In February 2019, he was sitting at a conference when he suddenly felt dizzy.  It is a sensation of movement.  There may be some associated mild nausea and slight pressure behind the right eye.  He didn't have any preceding event such as viral illness or head injury.  A few days later, he had severe episode lasting an entire day with spinning and nausea but no vomiting, in which he was incapacitated.  At some point, he gets dizzy almost daily.  It occurs spontaneously but it may be triggered with quick head movement or gaze to the left, walking down the aile at the grocery store or at night while driving and headlights are speeding by him.  Other than the slight pressure, there is no headache.  He notes some mild worsening vision in the right eye but hasn't followed up with the eye doctor.  He denies hearing loss, tinnitus or unilateral  numbness or weakness.  MRI of brain without contrast performed on 01/25/18 was personally reviewed and was normal.  I saw him in April 2019 and referred him to vestibular rehab.  ***  ***.  He went to the ED on 11/30/2018 for further evaluation.  CTA of head and neck showed normal brain and no intracranial or extracranial vascular abnormality.  A 7 mm polypoid lesion within the left aspect of the vallecula was seen and ENT referral was recommended.  ***    He reports past history of some headaches but nothing significant or anything that he would classify as migraine.    Past Medical History: Past Medical History:  Diagnosis Date   Asthma    as a child   Family history of adverse reaction to anesthesia    it takes my mother a long time to come out of anesthesia and PONV   Headache    Hypertension    Hypoglycemic reaction    history"hypoglycemic"   Pneumonia    Pneumonia- severe case 4'16 (multiple rounds oral antibiotics).Using Inhaler daily.Occ. residual cough.   PONV (postoperative nausea and vomiting)    Umbilical hernia    Wears glasses     Medications: Outpatient Encounter Medications as of 08/24/2019  Medication Sig   Aspirin-Acetaminophen-Caffeine (GOODY HEADACHE PO) Take 1 tablet by mouth daily as needed (headache).   budesonide-formoterol (SYMBICORT) 80-4.5 MCG/ACT inhaler Inhale 2 puffs into the lungs 2 (two) times daily.   cetirizine (ZYRTEC ALLERGY) 10 MG tablet Take  10 mg by mouth daily.   dextromethorphan-guaiFENesin (MUCINEX DM) 30-600 MG per 12 hr tablet Take 1 tablet by mouth 2 (two) times daily as needed for cough.   fexofenadine (ALLEGRA) 180 MG tablet Take 180 mg by mouth daily.   fluticasone (FLONASE) 50 MCG/ACT nasal spray Place 2 sprays into both nostrils daily.   losartan (COZAAR) 50 MG tablet Take 50 mg by mouth daily.   Multiple Vitamin (MULTIVITAMIN WITH MINERALS) TABS tablet Take 1 tablet by mouth daily.   oxyCODONE-acetaminophen  (ROXICET) 5-325 MG per tablet Take 1-2 tablets by mouth every 4 (four) hours as needed. (Patient not taking: Reported on 03/16/2018)   traZODone (DESYREL) 150 MG tablet Take 150 mg by mouth at bedtime.   zolpidem (AMBIEN) 10 MG tablet Take 10 mg by mouth at bedtime as needed for sleep.   No facility-administered encounter medications on file as of 08/24/2019.     Allergies: Allergies  Allergen Reactions   Chantix [Varenicline]     Family History: Family History  Problem Relation Age of Onset   Allergies Sister    Heart disease Mother    Anuerysm Father     Social History: Social History   Socioeconomic History   Marital status: Married    Spouse name: Belenda CruiseKristin   Number of children: 5   Years of education: Not on file   Highest education level: Some college, no degree  Occupational History   Occupation: Research officer, political partyolice Officer  Social Needs   Financial resource strain: Not on file   Food insecurity    Worry: Not on file    Inability: Not on file   Transportation needs    Medical: Not on file    Non-medical: Not on file  Tobacco Use   Smoking status: Former Smoker    Packs/day: 1.00    Years: 20.00    Pack years: 20.00    Types: Cigarettes    Quit date: 11/17/2010    Years since quitting: 8.7   Smokeless tobacco: Former NeurosurgeonUser    Types: Chew  Substance and Sexual Activity   Alcohol use: Yes    Alcohol/week: 0.0 standard drinks    Comment: occ.   Drug use: No   Sexual activity: Not on file  Lifestyle   Physical activity    Days per week: Not on file    Minutes per session: Not on file   Stress: Not on file  Relationships   Social connections    Talks on phone: Not on file    Gets together: Not on file    Attends religious service: Not on file    Active member of club or organization: Not on file    Attends meetings of clubs or organizations: Not on file    Relationship status: Not on file   Intimate partner violence    Fear of current or ex  partner: Not on file    Emotionally abused: Not on file    Physically abused: Not on file    Forced sexual activity: Not on file  Other Topics Concern   Not on file  Social History Narrative   Pt right handed, he is married, lives with wife and 5 sons in a 2 story house. He drinks 1-2 cups of coffee on the weekends. He runs and bicycles 3-4 x a week.    Observations/Objective:   *** No acute distress.  Alert and oriented.  Speech fluent and not dysarthric.  Language intact.  Eyes orthophoric on primary gaze.  Face symmetric.  Assessment and Plan:   ***  Follow Up Instructions:    -I discussed the assessment and treatment plan with the patient. The patient was provided an opportunity to ask questions and all were answered. The patient agreed with the plan and demonstrated an understanding of the instructions.   The patient was advised to call back or seek an in-person evaluation if the symptoms worsen or if the condition fails to improve as anticipated.    Total Time spent in visit with the patient was:  ***, of which more than 50% of the time was spent in counseling and/or coordinating care on ***.   Pt understands and agrees with the plan of care outlined.     Dudley Major, DO

## 2019-08-24 ENCOUNTER — Telehealth: Payer: 59 | Admitting: Neurology

## 2020-01-05 ENCOUNTER — Ambulatory Visit (INDEPENDENT_AMBULATORY_CARE_PROVIDER_SITE_OTHER): Payer: 59 | Admitting: Otolaryngology

## 2020-01-10 ENCOUNTER — Ambulatory Visit (INDEPENDENT_AMBULATORY_CARE_PROVIDER_SITE_OTHER): Payer: 59 | Admitting: Otolaryngology

## 2020-01-10 ENCOUNTER — Encounter (INDEPENDENT_AMBULATORY_CARE_PROVIDER_SITE_OTHER): Payer: Self-pay | Admitting: Otolaryngology

## 2020-01-10 ENCOUNTER — Other Ambulatory Visit: Payer: Self-pay

## 2020-01-10 VITALS — Temp 97.7°F

## 2020-01-10 DIAGNOSIS — R040 Epistaxis: Secondary | ICD-10-CM | POA: Diagnosis not present

## 2020-01-10 NOTE — Progress Notes (Signed)
HPI: Andrew Delgado is a 44 y.o. male who presents for evaluation of recurrent right-sided epistaxis.  The nosebleeds have been minor.  But they have been persistent ever since he had a Covid test back in November.  That is when they first started.  He sometimes blows out a little crust or scab and then has minor bleeding that stops rather easily. Denies any yellow-green discharge from his nose. He does not have any trouble breathing through his nose.  Past Medical History:  Diagnosis Date  . Asthma    as a child  . Family history of adverse reaction to anesthesia    it takes my mother a long time to come out of anesthesia and PONV  . Headache   . Hypertension   . Hypoglycemic reaction    history"hypoglycemic"  . Pneumonia    Pneumonia- severe case 4'16 (multiple rounds oral antibiotics).Using Inhaler daily.Occ. residual cough.  Marland Kitchen PONV (postoperative nausea and vomiting)   . Umbilical hernia   . Wears glasses    Past Surgical History:  Procedure Laterality Date  . ADENOIDECTOMY    . CIRCUMCISION    . INSERTION OF MESH N/A 07/13/2015   Procedure: INSERTION OF MESH;  Surgeon: Axel Filler, MD;  Location: [facility] OR;  Service: General;  Laterality: N/A;  . MYRINGOTOMY WITH TUBE PLACEMENT     x2 - 30 yrs ago  . UMBILICAL HERNIA REPAIR N/A 07/13/2015   Procedure: LAPAROSCOPIC UMBILICAL HERNIA REPAIR WITH MESH  X 2 ;  Surgeon: Axel Filler, MD;  Location: [facility] OR;  Service: General;  Laterality: N/A;  . WISDOM TOOTH EXTRACTION     Social History   Socioeconomic History  . Marital status: Married    Spouse name: Belenda Cruise  . Number of children: 5  . Years of education: Not on file  . Highest education level: Some college, no degree  Occupational History  . Occupation: Emergency planning/management officer  Tobacco Use  . Smoking status: Former Smoker    Packs/day: 1.00    Years: 20.00    Pack years: 20.00    Types: Cigarettes    Quit date: 11/17/2010    Years since quitting: 9.1  . Smokeless  tobacco: Former Neurosurgeon    Types: Chew  Substance and Sexual Activity  . Alcohol use: Yes    Alcohol/week: 0.0 standard drinks    Comment: occ.  . Drug use: No  . Sexual activity: Not on file  Other Topics Concern  . Not on file  Social History Narrative   Pt right handed, he is married, lives with wife and 5 sons in a 2 story house. He drinks 1-2 cups of coffee on the weekends. He runs and bicycles 3-4 x a week.   Social Determinants of Health   Financial Resource Strain:   . Difficulty of Paying Living Expenses: Not on file  Food Insecurity:   . Worried About Programme researcher, broadcasting/film/video in the Last Year: Not on file  . Ran Out of Food in the Last Year: Not on file  Transportation Needs:   . Lack of Transportation (Medical): Not on file  . Lack of Transportation (Non-Medical): Not on file  Physical Activity:   . Days of Exercise per Week: Not on file  . Minutes of Exercise per Session: Not on file  Stress:   . Feeling of Stress : Not on file  Social Connections:   . Frequency of Communication with Friends and Family: Not on file  . Frequency  of Social Gatherings with Friends and Family: Not on file  . Attends Religious Services: Not on file  . Active Member of Clubs or Organizations: Not on file  . Attends Archivist Meetings: Not on file  . Marital Status: Not on file   Family History  Problem Relation Age of Onset  . Allergies Sister   . Heart disease Mother   . Anuerysm Father    Allergies  Allergen Reactions  . Chantix [Varenicline]    Prior to Admission medications   Medication Sig Start Date End Date Taking? Authorizing Provider  Aspirin-Acetaminophen-Caffeine (GOODY HEADACHE PO) Take 1 tablet by mouth daily as needed (headache).   Yes [provider]  budesonide-formoterol (SYMBICORT) 80-4.5 MCG/ACT inhaler Inhale 2 puffs into the lungs 2 (two) times daily. 05/07/15  Yes Tanda Rockers, MD  cetirizine (ZYRTEC ALLERGY) 10 MG tablet Take 10 mg by mouth  daily.   Yes [provider]  dextromethorphan-guaiFENesin (MUCINEX DM) 30-600 MG per 12 hr tablet Take 1 tablet by mouth 2 (two) times daily as needed for cough.   Yes [provider]  fexofenadine (ALLEGRA) 180 MG tablet Take 180 mg by mouth daily.   Yes [provider]  fluticasone (FLONASE) 50 MCG/ACT nasal spray Place 2 sprays into both nostrils daily.   Yes [provider]  losartan (COZAAR) 50 MG tablet Take 50 mg by mouth daily.   Yes [provider]  Multiple Vitamin (MULTIVITAMIN WITH MINERALS) TABS tablet Take 1 tablet by mouth daily.   Yes [provider]  oxyCODONE-acetaminophen (ROXICET) 5-325 MG per tablet Take 1-2 tablets by mouth every 4 (four) hours as needed. 07/13/15  Yes Ralene Ok, MD  traZODone (DESYREL) 150 MG tablet Take 150 mg by mouth at bedtime.   Yes [provider]  zolpidem (AMBIEN) 10 MG tablet Take 10 mg by mouth at bedtime as needed for sleep.   Yes [provider]     Positive ROS: Otherwise negative.  All other systems have been reviewed and were otherwise negative with the exception of those mentioned in the HPI and as above.  Physical Exam: Constitutional: Alert, well-appearing, no acute distress Ears: External ears without lesions or tenderness. Ear canals are clear bilaterally with intact, clear TMs.  Nasal: External nose without lesions. Septum has moderate deflection with a concavity on the right anterior cartilaginous septum.  He has little bit crust on the right side of the septum.  He also has some crusting along the right inferior turbinate.Marland Kitchen  He also has a deflection of the septum in the region of the right inferior turbinate making this area very narrowed.  No evidence of any mucosal abnormality.  Both middle meatus regions appear clear.  No signs of infection. Oral: Lips and gums without lesions. Tongue and palate mucosa without lesions. Posterior oropharynx clear. Neck:  No palpable adenopathy or masses Respiratory: Breathing comfortably  Skin: No facial/neck lesions or rash noted.  Procedures  Assessment: Recurrent right-sided epistaxis most likely related to airflow and deformity of the septum.  Plan: Recommended regular use of either nasal gel or mupirocin 2% ointment twice a day for 5 to 7 days as this should help. Reviewed with him how to stop a more significant bleed with cotton ball and Afrin. If he continues to have recurrent bleeding or has a bad nosebleed will follow-up for cauterization if necessary.  But nothing appears to need cauterization at this point.  Radene Journey, MD

## 2020-01-16 ENCOUNTER — Encounter: Payer: Self-pay | Admitting: *Deleted

## 2020-01-16 NOTE — Progress Notes (Signed)
Virtual Visit via Video Note The purpose of this virtual visit is to provide medical care while limiting exposure to the novel coronavirus.    Consent was obtained for video visit:  Yes.   Answered questions that patient had about telehealth interaction:  Yes.   I discussed the limitations, risks, security and privacy concerns of performing an evaluation and management service by telemedicine. I also discussed with the patient that there may be a patient responsible charge related to this service. The patient expressed understanding and agreed to proceed.  Pt location: Home Physician Location: office Name of referring provider:  Lawerance Cruel, MD I connected with [name] at patients initiation/request on 01/17/2020 at  2:50 PM EST by video enabled telemedicine application and verified that I am speaking with the correct person using two identifiers. Pt MRN:  [id number] Pt DOB:  06-22-1976 Video Participants:  [name]   History of Present Illness:  Andrew Delgado is a 44 year old male whom I previously saw in 2019 for dizziness presents today for headache.  UPDATE: Since last visit for dizziness, he was seen in the ED on 11/30/2018 for episode of dizziness and right eye pressure.  CTA of head and neck personally reviewed were unremarkable.  Change in blood pressure and prescription  He reports history of exertional headaches all of his life.  They are typically mild and cause brief pressure with such activities.  They typically occur while working out.  In August 2020, he had a severe attack while holding his breath while weightlifting.  He developed a severe burning pain from his right parietal region to the right eye.  It lasted 2 to 3 hours but had a dull headache for the next 2 weeks.  No nausea, vomiting, visual disturbance, dizziness, photophobia, phonophobia, numbness or weakness.  He had two subsequent mild episodes but still lasting 2 days.  He  hasn't had an episode since November.  Excedrin does not seem to help.  In September 2020, exertional activity.  Weight lifting holding breath.  Right parietal region to eye 2 to 3 hours.  for dull 2 weeks.  It happened once or twice November.  Deep breathing helps.  2 days    OTHER HISTORY: Dizziness: In February 2019, he was sitting at a conference when he suddenly felt dizzy.  It is a sensation of movement.  There may be some associated mild nausea and slight pressure behind the right eye.  He didn't have any preceding event such as viral illness or head injury.  A few days later, he had severe episode lasting an entire day with spinning and nausea but no vomiting, in which he was incapacitated.  At some point, he gets dizzy almost daily.  It occurs spontaneously but it may be triggered with quick head movement or gaze to the left, walking down the aile at the grocery store or at night while driving and headlights are speeding by him.  Other than the slight pressure, there is no headache.  He denies hearing loss, tinnitus or unilateral numbness or weakness.  MRI of brain without contrast performed on 01/25/18 was personally reviewed and was normal.    Past Medical History: Past Medical History:  Diagnosis Date  . Asthma    as a child  . Family history of adverse reaction to anesthesia    it takes my mother a long time to come out of anesthesia and PONV  . Headache   .  Hypertension   . Hypoglycemic reaction    history"hypoglycemic"  . Pneumonia    Pneumonia- severe case 4'16 (multiple rounds oral antibiotics).Using Inhaler daily.Occ. residual cough.  Marland Kitchen PONV (postoperative nausea and vomiting)   . Umbilical hernia   . Wears glasses     Medications: Outpatient Encounter Medications as of 01/17/2020  Medication Sig  . budesonide-formoterol (SYMBICORT) 80-4.5 MCG/ACT inhaler Inhale 2 puffs into the lungs 2 (two) times daily.  Marland Kitchen dextromethorphan-guaiFENesin (MUCINEX DM) 30-600 MG per  12 hr tablet Take 1 tablet by mouth 2 (two) times daily as needed for cough.  . fexofenadine (ALLEGRA) 180 MG tablet Take 180 mg by mouth daily.  . fluticasone (FLONASE) 50 MCG/ACT nasal spray Place 2 sprays into both nostrils daily.  Marland Kitchen losartan (COZAAR) 50 MG tablet Take 50 mg by mouth daily.  . Multiple Vitamin (MULTIVITAMIN WITH MINERALS) TABS tablet Take 1 tablet by mouth daily.  Marland Kitchen oxyCODONE-acetaminophen (ROXICET) 5-325 MG per tablet Take 1-2 tablets by mouth every 4 (four) hours as needed. (Patient not taking: Reported on 01/16/2020)  . traZODone (DESYREL) 150 MG tablet Take 150 mg by mouth at bedtime.  Marland Kitchen zolpidem (AMBIEN) 10 MG tablet Take 10 mg by mouth at bedtime as needed for sleep.   No facility-administered encounter medications on file as of 01/17/2020.    Allergies: Allergies  Allergen Reactions  . Chantix [Varenicline]   . Prednisone     aggressive    Family History: Family History  Problem Relation Age of Onset  . Allergies Sister   . Heart disease Mother   . Anuerysm Father     Social History: Social History   Socioeconomic History  . Marital status: Married    Spouse name: Andrew Delgado  . Number of children: 5  . Years of education: Not on file  . Highest education level: Some college, no degree  Occupational History  . Occupation: Emergency planning/management officer  Tobacco Use  . Smoking status: Former Smoker    Packs/day: 1.00    Years: 20.00    Pack years: 20.00    Types: Cigarettes    Quit date: 11/17/2010    Years since quitting: 9.1  . Smokeless tobacco: Former Neurosurgeon    Types: Chew  Substance and Sexual Activity  . Alcohol use: Yes    Alcohol/week: 0.0 standard drinks    Comment: occ.  . Drug use: No  . Sexual activity: Not on file  Other Topics Concern  . Not on file  Social History Narrative   Pt right handed, he is married, lives with wife and 5 sons in a 2 story house. He drinks 1-2 cups of coffee on the weekends. He runs and bicycles 3-4 x a week.   Social  Determinants of Health   Financial Resource Strain:   . Difficulty of Paying Living Expenses: Not on file  Food Insecurity:   . Worried About Programme researcher, broadcasting/film/video in the Last Year: Not on file  . Ran Out of Food in the Last Year: Not on file  Transportation Needs:   . Lack of Transportation (Medical): Not on file  . Lack of Transportation (Non-Medical): Not on file  Physical Activity:   . Days of Exercise per Week: Not on file  . Minutes of Exercise per Session: Not on file  Stress:   . Feeling of Stress : Not on file  Social Connections:   . Frequency of Communication with Friends and Family: Not on file  . Frequency of Social Gatherings  with Friends and Family: Not on file  . Attends Religious Services: Not on file  . Active Member of Clubs or Organizations: Not on file  . Attends Banker Meetings: Not on file  . Marital Status: Not on file  Intimate Partner Violence:   . Fear of Current or Ex-Partner: Not on file  . Emotionally Abused: Not on file  . Physically Abused: Not on file  . Sexually Abused: Not on file    Observations/Objective:   There were no vitals taken for this visit. No acute distress.  Alert and oriented.  Speech fluent and not dysarthric.  Language intact.  Eyes orthophoric on primary gaze.  Face symmetric.  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 He has been doing well for several months.  Previous imaging including MRI of brain and CTA of head and neck negative for secondary etiology.  Therefore, I do not suspect ongoing serious secondary etiology.  As these events are so infrequent, we won't start a daily preventative and I won't have him pre-treat with indomethacin prior to each workout.  Instead, I will prescribe him indomethacin.  He can take 25mg  to 50mg  if he should develop a headache while working out.    Follow up in 6 months.  Follow Up Instructions:    -I discussed the assessment  and treatment plan with the patient. The patient was provided an opportunity to ask questions and all were answered. The patient agreed with the plan and demonstrated an understanding of the instructions.   The patient was advised to call back or seek an in-person evaluation if the symptoms worsen or if the condition fails to improve as anticipated.   , DO

## 2020-01-17 ENCOUNTER — Telehealth (INDEPENDENT_AMBULATORY_CARE_PROVIDER_SITE_OTHER): Payer: 59 | Admitting: Neurology

## 2020-01-17 ENCOUNTER — Other Ambulatory Visit: Payer: Self-pay

## 2020-01-17 ENCOUNTER — Encounter: Payer: Self-pay | Admitting: Neurology

## 2020-01-17 DIAGNOSIS — G4484 Primary exertional headache: Secondary | ICD-10-CM | POA: Diagnosis not present

## 2020-01-17 MED ORDER — INDOMETHACIN 25 MG PO CAPS: ORAL_CAPSULE | ORAL | 3 refills | Status: DC

## 2020-02-16 IMAGING — CT CT ANGIOGRAPHY HEAD
1 of 14 series · 5 of 33 positions shown · IV contrast (iopamidol)
Comparison: None available.

CLINICAL DATA: Initial evaluation for acute right-sided visual
loss.

EXAM:
CT ANGIOGRAPHY HEAD AND NECK
TECHNIQUE: Multidetector CT imaging of the head and neck was performed using
the standard protocol during bolus administration of intravenous
contrast. Multiplanar CT image reconstructions and MIPs were
obtained to evaluate the vascular anatomy. Carotid stenosis
measurements (when applicable) are obtained utilizing NASCET
criteria, using the distal internal carotid diameter as the
denominator.
CONTRAST:  100mL QGQC1U-LTY IOPAMIDOL (QGQC1U-LTY) INJECTION 76%

[Series 12: axial thin · axial · 0.39mm/px · z∈[+994,+1250]mm · 5 of 395 slices shown]
[im 66/395  soft-tissue]
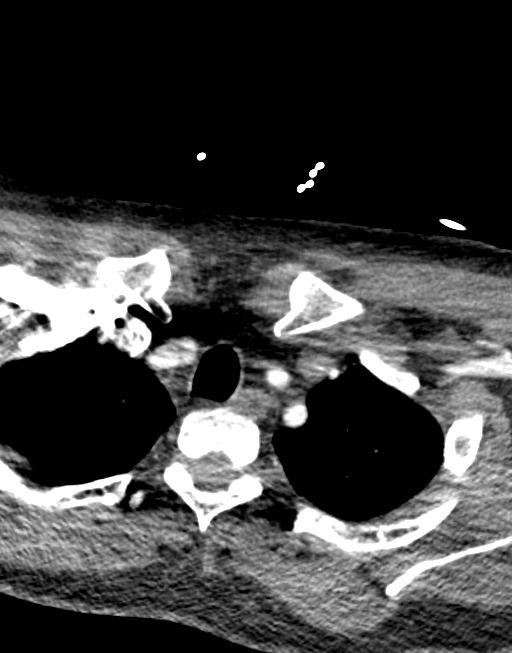
[im 132/395  bone]
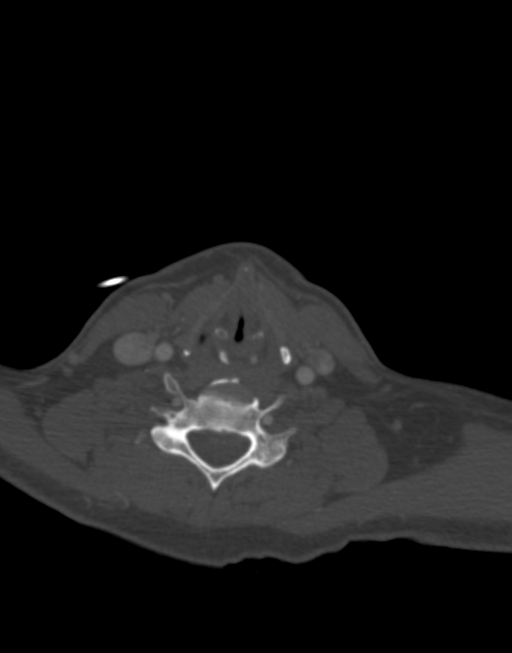
[im 198/395  soft-tissue]
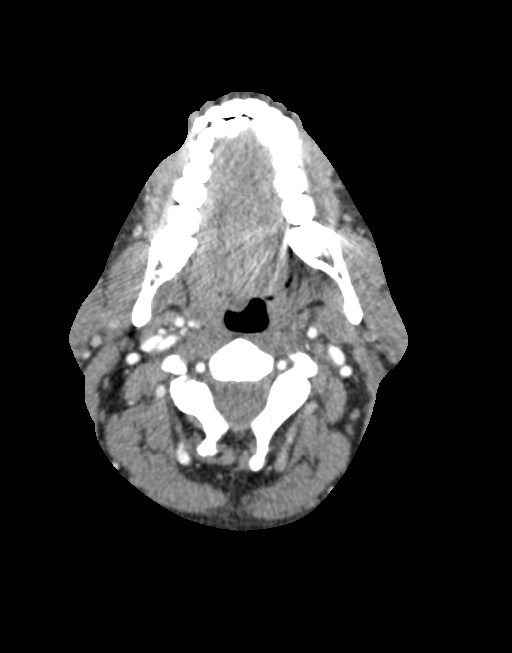
[im 263/395  bone]
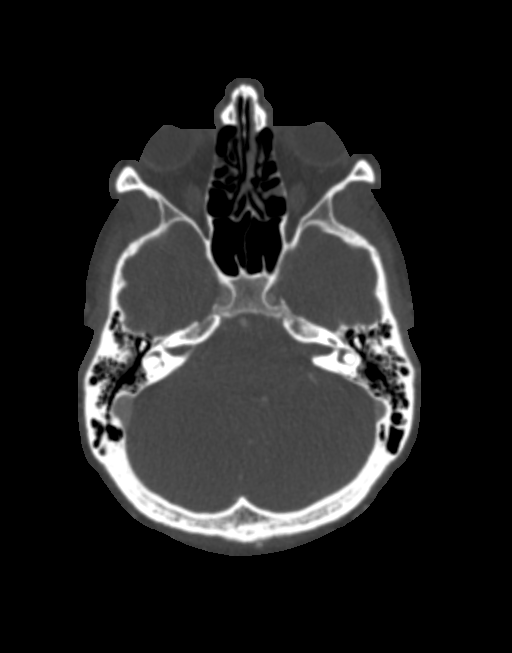
[im 329/395  soft-tissue]
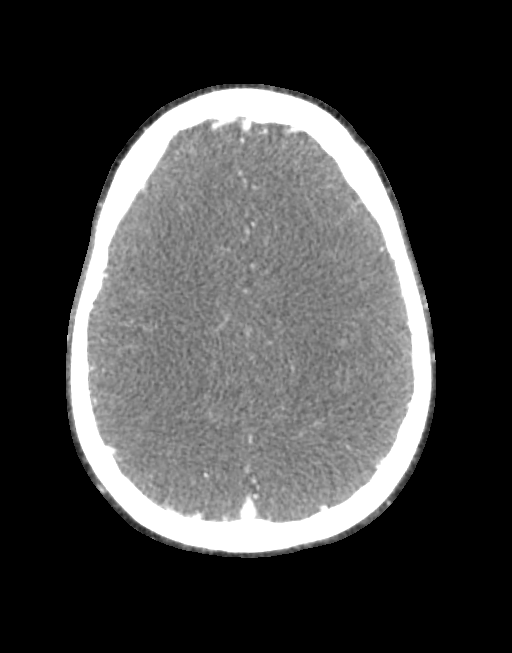

[5 of 33 positions shown; findings below may reference images not displayed]

FINDINGS: CT HEAD FINDINGS

Brain: Cerebral volume within normal limits for patient age.

No evidence for acute intracranial hemorrhage. No findings to
suggest acute large vessel territory infarct. No mass lesion,
midline shift, or mass effect. Ventricles are normal in size without
evidence for hydrocephalus. No extra-axial fluid collection
identified.

Vascular: No hyperdense vessel identified.

Skull: Scalp soft tissues demonstrate no acute abnormality.
Calvarium intact.

Sinuses/Orbits: Globes and orbital soft tissues within normal
limits.

Visualized paranasal sinuses are clear. No mastoid effusion.

CTA NECK FINDINGS

Aortic arch: Visualized aortic arch of normal caliber with normal
branch pattern. No flow-limiting stenosis about the origin of the
great vessels. Visualized subclavian arteries widely patent.

Right carotid system: Right common and internal carotid arteries
widely patent without stenosis, dissection, or occlusion.

Left carotid system: Left common carotid artery widely patent from
its origin to the bifurcation. Minimal atheromatous plaque about the
left bifurcation without significant stenosis. Left ICA widely
patent distally to the skull base without stenosis, dissection or
occlusion.

Vertebral arteries: Both of the vertebral arteries arise from the
subclavian arteries and are widely patent to the skull base. No
dissection or vascular occlusion.

Skeleton: No acute osseous abnormality. No discrete lytic or blastic
osseous lesions.

Other neck: No acute soft tissue abnormality within the neck. 7 mm
rounded soft tissue lesion at the left aspect of the vallecula
noted, indeterminate (series 10, image 74).

Upper chest: Visualized upper chest demonstrates no acute finding.

Review of the MIP images confirms the above findings

CTA HEAD FINDINGS

Anterior circulation: Internal carotid arteries widely patent to the
termini without stenosis. A1 segments, anterior communicating artery
common anterior cerebral arteries widely to their distal aspects. M1
segments widely patent bilaterally without stenosis. Distal MCA
branches well perfused and symmetric.

Posterior circulation: Vertebral arteries widely patent to the
vertebrobasilar junction without stenosis. Posterior inferior
cerebral arteries patent bilaterally. Basilar artery widely patent
to its distal aspect without stenosis. Superior cerebellar and
posterior cerebral arteries well perfused and widely patent
bilaterally.

Venous sinuses: Patent.

Anatomic variants: None significant.  No intracranial aneurysm.

Delayed phase: No abnormal enhancement.

Review of the MIP images confirms the above findings
IMPRESSION: CT HEAD IMPRESSION

Normal head CT.  No acute intracranial abnormality identified.

CTA HEAD AND NECK IMPRESSION

1. Negative CTA of the head and neck. No acute vascular abnormality.
No large vessel occlusion or hemodynamically significant stenosis.
2. 7 mm polypoid lesion within the left aspect of the vallecula,
indeterminate. Nonemergent outpatient ENT referral for further
evaluation and direct visualization suggested.

## 2020-04-22 NOTE — Progress Notes (Signed)
Cardiology Office Note:    Date:  04/23/2020   ID:  TRUE J III Sinor, DOB 07/19/1976, MRN [id number]  PCP:  Ross, Charles Alan, MD  Cardiologist:  No primary care provider on file.  Electrophysiologist:  None   Referring MD: Ross, Charles Alan, MD   Chief Complaint  Patient presents with  . Chest Pain     History of Present Illness:    Andrew Delgado is a 44 y.o. male with a hx of asthma, hypertension who is referred by Dr. Ross for evaluation of chest pain.  He reports that he has been having intermittent chest pain since 2017 but has progressed over the last 2 weeks.  Reports that on 05/16/2020 he went for a hike with his wife.  Hiked for total of about 10 miles and on the way back began having left-sided chest pain.  Described as sharp but sometimes felt more like a squeezing pain over the left side of his chest that occurred while walking.  He got to his car and pain eventually subsided.  Lasted for about 15 to 20 minutes.  The following day he went for his usual 2 to 4 mile walk and reported the onset of the same chest pain.  States that he stopped walking and pain subsided.  He has not exercised since that time, but states that he had onset of chest pain with walking around grocery store last week.  Reports BP has been under good control.  He smoked 1.5 to 2 packs/day x 20 years, quit in 2012.  Family history includes mother had MI at age 53 and father died of CVA in 60s.    Past Medical History:  Diagnosis Date  . Asthma    as a child  . Family history of adverse reaction to anesthesia    it takes my mother a long time to come out of anesthesia and PONV  . Headache   . Hypertension   . Hypoglycemic reaction    history"hypoglycemic"  . Pneumonia    Pneumonia- severe case 4'16 (multiple rounds oral antibiotics).Using Inhaler daily.Occ. residual cough.  . PONV (postoperative nausea and vomiting)   . Umbilical hernia   . Wears glasses     Past Surgical History:    Procedure Laterality Date  . ADENOIDECTOMY    . CIRCUMCISION    . INSERTION OF MESH N/A 07/13/2015   Procedure: INSERTION OF MESH;  Surgeon: Armando Ramirez, MD;  Location: [facility] OR;  Service: General;  Laterality: N/A;  . MYRINGOTOMY WITH TUBE PLACEMENT     x2 - 30 yrs ago  . UMBILICAL HERNIA REPAIR N/A 07/13/2015   Procedure: LAPAROSCOPIC UMBILICAL HERNIA REPAIR WITH MESH  X 2 ;  Surgeon: Armando Ramirez, MD;  Location: [facility] OR;  Service: General;  Laterality: N/A;  . WISDOM TOOTH EXTRACTION      Current Medications: No outpatient medications have been marked as taking for the 04/23/20 encounter (Office Visit) with Zharia Conrow L, MD.   Current Facility-Administered Medications for the 04/23/20 encounter (Office Visit) with Alnita Aybar L, MD  Medication  . sodium chloride flush (NS) 0.9 % injection 3 mL     Allergies:   Chantix [varenicline] and Prednisone   Social History   Socioeconomic History  . Marital status: Married    Spouse name: Kristin  . Number of children: 5  . Years of education: Not on file  . Highest education level: Some college, no degree  Occupational History  . Occupation:   Engineer, structural  Tobacco Use  . Smoking status: Former Smoker    Packs/day: 1.00    Years: 20.00    Pack years: 20.00    Types: Cigarettes    Quit date: 11/17/2010    Years since quitting: 9.4  . Smokeless tobacco: Former Systems developer    Types: Chew  Substance and Sexual Activity  . Alcohol use: Yes    Alcohol/week: 0.0 standard drinks    Comment: occ.  . Drug use: No  . Sexual activity: Not on file  Other Topics Concern  . Not on file  Social History Narrative   Pt right handed, he is married, lives with wife and 5 sons in a 2 story house. He drinks 1-2 cups of coffee on the weekends. He runs and bicycles 3-4 x a week.   Social Determinants of Health   Financial Resource Strain:   . Difficulty of Paying Living Expenses:   Food Insecurity:   . Worried About Ship broker in the Last Year:   . Arboriculturist in the Last Year:   Transportation Needs:   . Film/video editor (Medical):   Marland Kitchen Lack of Transportation (Non-Medical):   Physical Activity:   . Days of Exercise per Week:   . Minutes of Exercise per Session:   Stress:   . Feeling of Stress :   Social Connections:   . Frequency of Communication with Friends and Family:   . Frequency of Social Gatherings with Friends and Family:   . Attends Religious Services:   . Active Member of Clubs or Organizations:   . Attends Archivist Meetings:   Marland Kitchen Marital Status:      Family History: The patient's family history includes Allergies in his sister; Anuerysm in his father; Heart disease in his mother.  ROS:   Please see the history of present illness.    All other systems reviewed and are negative.  EKGs/Labs/Other Studies Reviewed:    The following studies were reviewed today:   EKG:  EKG is ordered today.  The ekg ordered today demonstrates normal sinus rhythm, rate 91, no ST/T abnormalities  Recent Labs: No results found for requested labs within last 8760 hours.  Recent Lipid Panel No results found for: CHOL, TRIG, HDL, CHOLHDL, VLDL, LDLCALC, LDLDIRECT  Physical Exam:    VS:  BP 115/70   Pulse 91   Temp (!) 97 F (36.1 C)   Ht 5\' 8"  (1.727 m)   Wt 187 lb 3.2 oz (84.9 kg)   SpO2 98%   BMI 28.46 kg/m     Wt Readings from Last 3 Encounters:  04/23/20 187 lb 3.2 oz (84.9 kg)  01/16/20 172 lb (78 kg)  11/30/18 170 lb (77.1 kg)     GEN: Well nourished, well developed in no acute distress HEENT: Normal NECK: No JVD; No carotid bruits LYMPHATICS: No lymphadenopathy CARDIAC: RRR, no murmurs, rubs, gallops RESPIRATORY:  Clear to auscultation without rales, wheezing or rhonchi  ABDOMEN: Soft, non-tender, non-distended MUSCULOSKELETAL:  No edema; No deformity  SKIN: Warm and dry NEUROLOGIC:  Alert and oriented x 3 PSYCHIATRIC:  Normal affect   ASSESSMENT:     1. Chest pain of uncertain etiology   2. Pre-procedure lab exam   3. Essential hypertension    PLAN:    Chest pain: describes exertional chest pain that resolves with rest.  He does have significant CAD risk factors (hypertension, tobacco use, family history). - Give progressive exertional chest  pain, recommend cardiac catheterization for further evaluation.  Risks and benefits of cardiac catheterization have been discussed with the patient.  These include bleeding, infection, kidney damage, stroke, heart attack, death.  The patient understands these risks and is willing to proceed. - Start ASA 81 mg daily - Start metoprolol 25 mg BID - PRN SL NTG  Hypertension: On telmisartan 80 mg daily.  Appears controlled.  Will start metoprolol for antianginal effect as above  RTC in 2 weeks   Medication Adjustments/Labs and Tests Ordered: Current medicines are reviewed at length with the patient today.  Concerns regarding medicines are outlined above.  Orders Placed This Encounter  Procedures  . Basic metabolic panel  . CBC  . EKG 12-Lead   Meds ordered this encounter  Medications  . metoprolol tartrate (LOPRESSOR) 25 MG tablet    Sig: Take 1 tablet (25 mg total) by mouth 2 (two) times daily.    Dispense:  180 tablet    Refill:  3  . nitroGLYCERIN (NITROSTAT) 0.4 MG SL tablet    Sig: Place 1 tablet (0.4 mg total) under the tongue every 5 (five) minutes as needed for chest pain.    Dispense:  25 tablet    Refill:  3  . aspirin EC 81 MG tablet    Sig: Take 1 tablet (81 mg total) by mouth daily.    Dispense:  90 tablet    Refill:  3    Patient Instructions     [facility] CARDIOVASCULAR DIVISION [facility] [place] [address] [place] [address] Dept: [phone] Loc: [phone]  TOMI PADDOCK  04/23/2020  You are scheduled for a Cardiac Catheterization on Thursday, June 10 with Dr. Nanetta Batty.  1. Please arrive  at the [facility] (Main Entrance A) at [facility]: [address] [place], [place] [address] at 9:30 AM (This time is two hours before your procedure to ensure your preparation). Free valet parking service is available.   Special note: Every effort is made to have your procedure done on time. Please understand that emergencies sometimes delay scheduled procedures.  2. Diet: Do not eat solid foods after midnight.  The patient may have clear liquids until 5am upon the day of the procedure.  3. Labs: Today in office  Covid Test: Today at 1:05 pm [address]  4. Medication instructions in preparation for your procedure:   Contrast Allergy: No  Hold Losartan AM of procedure   On the morning of your procedure, take your Aspirin (81 mg ) and any morning medicines NOT listed above.  You may use sips of water.  5. Plan for one night stay--bring personal belongings. 6. Bring a current list of your medications and current insurance cards. 7. You MUST have a responsible person to drive you home. 8. Someone MUST be with you the first 24 hours after you arrive home or your discharge will be delayed. 9. Please wear clothes that are easy to get on and off and wear slip-on shoes.  Thank you for allowing [place] to care for you!   -- [facility]     Start aspirin 81 mg daily  Start metoprolol tartrate (Lopressor) 25 mg two times daily  Take sublingual nitroglycerin AS NEEDED for chest pain  Follow up in 2 weeks with Dr. Bjorn Pippin     Signed, Little Ishikawa, MD  04/23/2020 6:14 PM    [facility]

## 2020-04-22 NOTE — H&P (View-Only) (Signed)
Cardiology Office Note:    Date:  04/23/2020   ID:  Andrew Delgado, DOB 1976/06/07, MRN [id number]  PCP:  Daisy Floro, MD  Cardiologist:  No primary care provider on file.  Electrophysiologist:  None   Referring MD: Daisy Floro, MD   Chief Complaint  Patient presents with  . Chest Pain     History of Present Illness:    Andrew Delgado is a 44 y.o. male with a hx of asthma, hypertension who is referred by Dr. Tenny Craw for evaluation of chest pain.  He reports that he has been having intermittent chest pain since 2017 but has progressed over the last 2 weeks.  Reports that on 05/16/2020 he went for a hike with his wife.  Hiked for total of about 10 miles and on the way back began having left-sided chest pain.  Described as sharp but sometimes felt more like a squeezing pain over the left side of his chest that occurred while walking.  He got to his car and pain eventually subsided.  Lasted for about 15 to 20 minutes.  The following day he went for his usual 2 to 4 mile walk and reported the onset of the same chest pain.  States that he stopped walking and pain subsided.  He has not exercised since that time, but states that he had onset of chest pain with walking around grocery store last week.  Reports BP has been under good control.  He smoked 1.5 to 2 packs/day x 20 years, quit in 2012.  Family history includes mother had MI at age 89 and father died of CVA in 35s.    Past Medical History:  Diagnosis Date  . Asthma    as a child  . Family history of adverse reaction to anesthesia    it takes my mother a long time to come out of anesthesia and PONV  . Headache   . Hypertension   . Hypoglycemic reaction    history"hypoglycemic"  . Pneumonia    Pneumonia- severe case 4'16 (multiple rounds oral antibiotics).Using Inhaler daily.Occ. residual cough.  Marland Kitchen PONV (postoperative nausea and vomiting)   . Umbilical hernia   . Wears glasses     Past Surgical History:    Procedure Laterality Date  . ADENOIDECTOMY    . CIRCUMCISION    . INSERTION OF MESH N/A 07/13/2015   Procedure: INSERTION OF MESH;  Surgeon: Axel Filler, MD;  Location: [facility] OR;  Service: General;  Laterality: N/A;  . MYRINGOTOMY WITH TUBE PLACEMENT     x2 - 30 yrs ago  . UMBILICAL HERNIA REPAIR N/A 07/13/2015   Procedure: LAPAROSCOPIC UMBILICAL HERNIA REPAIR WITH MESH  X 2 ;  Surgeon: Axel Filler, MD;  Location: [facility] OR;  Service: General;  Laterality: N/A;  . WISDOM TOOTH EXTRACTION      Current Medications: No outpatient medications have been marked as taking for the 04/23/20 encounter (Office Visit) with Little Ishikawa, MD.   Current Facility-Administered Medications for the 04/23/20 encounter (Office Visit) with Little Ishikawa, MD  Medication  . sodium chloride flush (NS) 0.9 % injection 3 mL     Allergies:   Chantix [varenicline] and Prednisone   Social History   Socioeconomic History  . Marital status: Married    Spouse name: Belenda Cruise  . Number of children: 5  . Years of education: Not on file  . Highest education level: Some college, no degree  Occupational History  . Occupation:  Engineer, structural  Tobacco Use  . Smoking status: Former Smoker    Packs/day: 1.00    Years: 20.00    Pack years: 20.00    Types: Cigarettes    Quit date: 11/17/2010    Years since quitting: 9.4  . Smokeless tobacco: Former Systems developer    Types: Chew  Substance and Sexual Activity  . Alcohol use: Yes    Alcohol/week: 0.0 standard drinks    Comment: occ.  . Drug use: No  . Sexual activity: Not on file  Other Topics Concern  . Not on file  Social History Narrative   Pt right handed, he is married, lives with wife and 5 sons in a 2 story house. He drinks 1-2 cups of coffee on the weekends. He runs and bicycles 3-4 x a week.   Social Determinants of Health   Financial Resource Strain:   . Difficulty of Paying Living Expenses:   Food Insecurity:   . Worried About Ship broker in the Last Year:   . Arboriculturist in the Last Year:   Transportation Needs:   . Film/video editor (Medical):   Marland Kitchen Lack of Transportation (Non-Medical):   Physical Activity:   . Days of Exercise per Week:   . Minutes of Exercise per Session:   Stress:   . Feeling of Stress :   Social Connections:   . Frequency of Communication with Friends and Family:   . Frequency of Social Gatherings with Friends and Family:   . Attends Religious Services:   . Active Member of Clubs or Organizations:   . Attends Archivist Meetings:   Marland Kitchen Marital Status:      Family History: The patient's family history includes Allergies in his sister; Anuerysm in his father; Heart disease in his mother.  ROS:   Please see the history of present illness.    All other systems reviewed and are negative.  EKGs/Labs/Other Studies Reviewed:    The following studies were reviewed today:   EKG:  EKG is ordered today.  The ekg ordered today demonstrates normal sinus rhythm, rate 91, no ST/T abnormalities  Recent Labs: No results found for requested labs within last 8760 hours.  Recent Lipid Panel No results found for: CHOL, TRIG, HDL, CHOLHDL, VLDL, LDLCALC, LDLDIRECT  Physical Exam:    VS:  BP 115/70   Pulse 91   Temp (!) 97 F (36.1 C)   Ht 5\' 8"  (1.727 m)   Wt 187 lb 3.2 oz (84.9 kg)   SpO2 98%   BMI 28.46 kg/m     Wt Readings from Last 3 Encounters:  04/23/20 187 lb 3.2 oz (84.9 kg)  01/16/20 172 lb (78 kg)  11/30/18 170 lb (77.1 kg)     GEN: Well nourished, well developed in no acute distress HEENT: Normal NECK: No JVD; No carotid bruits LYMPHATICS: No lymphadenopathy CARDIAC: RRR, no murmurs, rubs, gallops RESPIRATORY:  Clear to auscultation without rales, wheezing or rhonchi  ABDOMEN: Soft, non-tender, non-distended MUSCULOSKELETAL:  No edema; No deformity  SKIN: Warm and dry NEUROLOGIC:  Alert and oriented x 3 PSYCHIATRIC:  Normal affect   ASSESSMENT:     1. Chest pain of uncertain etiology   2. Pre-procedure lab exam   3. Essential hypertension    PLAN:    Chest pain: describes exertional chest pain that resolves with rest.  He does have significant CAD risk factors (hypertension, tobacco use, family history). - Give progressive exertional chest  pain, recommend cardiac catheterization for further evaluation.  Risks and benefits of cardiac catheterization have been discussed with the patient.  These include bleeding, infection, kidney damage, stroke, heart attack, death.  The patient understands these risks and is willing to proceed. - Start ASA 81 mg daily - Start metoprolol 25 mg BID - PRN SL NTG  Hypertension: On telmisartan 80 mg daily.  Appears controlled.  Will start metoprolol for antianginal effect as above  RTC in 2 weeks   Medication Adjustments/Labs and Tests Ordered: Current medicines are reviewed at length with the patient today.  Concerns regarding medicines are outlined above.  Orders Placed This Encounter  Procedures  . Basic metabolic panel  . CBC  . EKG 12-Lead   Meds ordered this encounter  Medications  . metoprolol tartrate (LOPRESSOR) 25 MG tablet    Sig: Take 1 tablet (25 mg total) by mouth 2 (two) times daily.    Dispense:  180 tablet    Refill:  3  . nitroGLYCERIN (NITROSTAT) 0.4 MG SL tablet    Sig: Place 1 tablet (0.4 mg total) under the tongue every 5 (five) minutes as needed for chest pain.    Dispense:  25 tablet    Refill:  3  . aspirin EC 81 MG tablet    Sig: Take 1 tablet (81 mg total) by mouth daily.    Dispense:  90 tablet    Refill:  3    Patient Instructions     [facility] CARDIOVASCULAR DIVISION [facility] [address] [place] [address] Dept: [phone] Loc: [phone]  TOMI PADDOCK  04/23/2020  You are scheduled for a Cardiac Catheterization on Thursday, June 10 with Dr. Nanetta Batty.  1. Please arrive  at the [facility] (Main Entrance A) at [facility]: [address] [place], [place] [address] at 9:30 AM (This time is two hours before your procedure to ensure your preparation). Free valet parking service is available.   Special note: Every effort is made to have your procedure done on time. Please understand that emergencies sometimes delay scheduled procedures.  2. Diet: Do not eat solid foods after midnight.  The patient may have clear liquids until 5am upon the day of the procedure.  3. Labs: Today in office  Covid Test: Today at 1:05 pm [address]  4. Medication instructions in preparation for your procedure:   Contrast Allergy: No  Hold Losartan AM of procedure   On the morning of your procedure, take your Aspirin (81 mg ) and any morning medicines NOT listed above.  You may use sips of water.  5. Plan for one night stay--bring personal belongings. 6. Bring a current list of your medications and current insurance cards. 7. You MUST have a responsible person to drive you home. 8. Someone MUST be with you the first 24 hours after you arrive home or your discharge will be delayed. 9. Please wear clothes that are easy to get on and off and wear slip-on shoes.  Thank you for allowing [place] to care for you!   -- [facility]     Start aspirin 81 mg daily  Start metoprolol tartrate (Lopressor) 25 mg two times daily  Take sublingual nitroglycerin AS NEEDED for chest pain  Follow up in 2 weeks with Dr. Bjorn Pippin     Signed, Little Ishikawa, MD  04/23/2020 6:14 PM    [facility]

## 2020-04-23 ENCOUNTER — Other Ambulatory Visit: Payer: Self-pay | Admitting: *Deleted

## 2020-04-23 ENCOUNTER — Other Ambulatory Visit (HOSPITAL_COMMUNITY)
Admission: RE | Admit: 2020-04-23 | Discharge: 2020-04-23 | Disposition: A | Discharge status: Home / Self Care | Payer: 59 | Source: Ambulatory Visit | Attending: Cardiovascular Disease | Admitting: Cardiovascular Disease

## 2020-04-23 ENCOUNTER — Other Ambulatory Visit: Payer: Self-pay

## 2020-04-23 ENCOUNTER — Ambulatory Visit: Payer: 59 | Admitting: Cardiology

## 2020-04-23 ENCOUNTER — Other Ambulatory Visit (HOSPITAL_COMMUNITY): Payer: 59

## 2020-04-23 ENCOUNTER — Encounter: Payer: Self-pay | Admitting: Cardiology

## 2020-04-23 VITALS — BP 115/70 | HR 91 | Temp 97.0°F | Ht 68.0 in | Wt 187.2 lb

## 2020-04-23 DIAGNOSIS — Z01812 Encounter for preprocedural laboratory examination: Secondary | ICD-10-CM | POA: Diagnosis not present

## 2020-04-23 DIAGNOSIS — R079 Chest pain, unspecified: Secondary | ICD-10-CM | POA: Diagnosis not present

## 2020-04-23 DIAGNOSIS — I10 Essential (primary) hypertension: Secondary | ICD-10-CM

## 2020-04-23 DIAGNOSIS — Z01810 Encounter for preprocedural cardiovascular examination: Secondary | ICD-10-CM | POA: Diagnosis not present

## 2020-04-23 DIAGNOSIS — Z20822 Contact with and (suspected) exposure to covid-19: Secondary | ICD-10-CM | POA: Insufficient documentation

## 2020-04-23 LAB — SARS CORONAVIRUS 2 (TAT 6-24 HRS): SARS Coronavirus 2: NEGATIVE

## 2020-04-23 MED ORDER — METOPROLOL TARTRATE 25 MG PO TABS: 25.0000 mg | ORAL_TABLET | Freq: Two times a day (BID) | ORAL | 3 refills | Status: DC

## 2020-04-23 MED ORDER — NITROGLYCERIN 0.4 MG SL SUBL: 0.4000 mg | SUBLINGUAL_TABLET | SUBLINGUAL | 3 refills | Status: DC | PRN

## 2020-04-23 MED ORDER — SODIUM CHLORIDE 0.9% FLUSH: 3.0000 mL | Freq: Two times a day (BID) | INTRAVENOUS | Status: AC

## 2020-04-23 MED ORDER — ASPIRIN EC 81 MG PO TBEC: 81.0000 mg | DELAYED_RELEASE_TABLET | Freq: Every day | ORAL | 3 refills | Status: DC

## 2020-04-23 NOTE — Patient Instructions (Addendum)
    [facility] CARDIOVASCULAR DIVISION [facility] [address] [place] [address] [place] [address] Dept: [phone] Loc: [phone]  MALAKAI SCHOENHERR  04/23/2020  You are scheduled for a Cardiac Catheterization on Thursday, June 10 with Dr. Nanetta Batty.  1. Please arrive at the [facility] (Main Entrance A) at [facility]: [address] [place], [place] [address] at 9:30 AM (This time is two hours before your procedure to ensure your preparation). Free valet parking service is available.   Special note: Every effort is made to have your procedure done on time. Please understand that emergencies sometimes delay scheduled procedures.  2. Diet: Do not eat solid foods after midnight.  The patient may have clear liquids until 5am upon the day of the procedure.  3. Labs: Today in office  Covid Test: Today at 1:05 pm [address]  4. Medication instructions in preparation for your procedure:   Contrast Allergy: No  Hold Losartan AM of procedure   On the morning of your procedure, take your Aspirin (81 mg ) and any morning medicines NOT listed above.  You may use sips of water.  5. Plan for one night stay--bring personal belongings. 6. Bring a current list of your medications and current insurance cards. 7. You MUST have a responsible person to drive you home. 8. Someone MUST be with you the first 24 hours after you arrive home or your discharge will be delayed. 9. Please wear clothes that are easy to get on and off and wear slip-on shoes.  Thank you for allowing [place] to care for you!   -- [facility]     Start aspirin 81 mg daily  Start metoprolol tartrate (Lopressor) 25 mg two times daily  Take sublingual nitroglycerin AS NEEDED for chest pain  Follow up in 2 weeks with Dr. Bjorn Pippin

## 2020-04-24 LAB — CBC
Hematocrit: 45.3 % (ref 37.5–51.0)
Hemoglobin: 16.2 g/dL (ref 13.0–17.7)
MCH: 34.7 pg — ABNORMAL HIGH (ref 26.6–33.0)
MCHC: 35.8 g/dL — ABNORMAL HIGH (ref 31.5–35.7)
MCV: 97 fL (ref 79–97)
Platelets: 196 10*3/uL (ref 150–450)
RBC: 4.67 x10E6/uL (ref 4.14–5.80)
RDW: 11.9 % (ref 11.6–15.4)
WBC: 4.6 10*3/uL (ref 3.4–10.8)

## 2020-04-24 LAB — BASIC METABOLIC PANEL
BUN/Creatinine Ratio: 14 (ref 9–20)
BUN: 11 mg/dL (ref 6–24)
CO2: 26 mmol/L (ref 20–29)
Calcium: 10.2 mg/dL (ref 8.7–10.2)
Chloride: 101 mmol/L (ref 96–106)
Creatinine, Ser: 0.8 mg/dL (ref 0.76–1.27)
GFR calc Af Amer: 126 mL/min/{1.73_m2} (ref >59)
GFR calc non Af Amer: 109 mL/min/{1.73_m2} (ref >59)
Glucose: 84 mg/dL (ref 65–99)
Potassium: 4.9 mmol/L (ref 3.5–5.2)
Sodium: 144 mmol/L (ref 134–144)

## 2020-04-25 ENCOUNTER — Telehealth: Payer: Self-pay | Admitting: *Deleted

## 2020-04-25 NOTE — Telephone Encounter (Signed)
Pt contacted pre-catheterization scheduled at [facility] for: Thursday April 26, 2020 11:30 AM Verified arrival time and place: [facility] Main Entrance A [facility]) at: 9:30 AM   No solid food after midnight prior to cath, clear liquids until 5 AM day of procedure.  AM meds can be  taken pre-cath with sip of water including: ASA 81 mg   Confirmed patient has responsible adult to drive home post procedure and observe 24 hours after arriving home:   You are allowed ONE visitor in the waiting room during your procedure. Both you and your visitor must wear masks.      COVID-19 Pre-Screening Questions:  . In the past 7 to 10 days have you had a cough,  shortness of breath, headache, congestion, fever (100 or greater) body aches, chills, sore throat, or sudden loss of taste or sense of smell? . Have you been around anyone with known Covid 19 in the past 7 to 10 days?   LMTCB for patient to review procedure instructions.

## 2020-04-26 ENCOUNTER — Telehealth: Payer: Self-pay | Admitting: Cardiology

## 2020-04-26 ENCOUNTER — Encounter (HOSPITAL_COMMUNITY): Payer: Self-pay | Admitting: Cardiovascular Disease

## 2020-04-26 ENCOUNTER — Encounter (HOSPITAL_COMMUNITY)
Admission: RE | Disposition: A | Discharge status: Home / Self Care | Payer: Self-pay | Source: Home / Self Care | Attending: Cardiovascular Disease

## 2020-04-26 ENCOUNTER — Other Ambulatory Visit: Payer: Self-pay

## 2020-04-26 ENCOUNTER — Ambulatory Visit (HOSPITAL_COMMUNITY)
Admission: RE | Admit: 2020-04-26 | Discharge: 2020-04-26 | Disposition: A | Discharge status: Home / Self Care | Payer: 59 | Attending: Cardiovascular Disease | Admitting: Cardiovascular Disease

## 2020-04-26 DIAGNOSIS — Z888 Allergy status to other drugs, medicaments and biological substances status: Secondary | ICD-10-CM | POA: Insufficient documentation

## 2020-04-26 DIAGNOSIS — Z87891 Personal history of nicotine dependence: Secondary | ICD-10-CM | POA: Insufficient documentation

## 2020-04-26 DIAGNOSIS — I10 Essential (primary) hypertension: Secondary | ICD-10-CM | POA: Diagnosis not present

## 2020-04-26 DIAGNOSIS — Z8249 Family history of ischemic heart disease and other diseases of the circulatory system: Secondary | ICD-10-CM | POA: Insufficient documentation

## 2020-04-26 DIAGNOSIS — R079 Chest pain, unspecified: Secondary | ICD-10-CM | POA: Insufficient documentation

## 2020-04-26 HISTORY — PX: LEFT HEART CATH AND CORONARY ANGIOGRAPHY: CATH118249

## 2020-04-26 SURGERY — LEFT HEART CATH AND CORONARY ANGIOGRAPHY
Anesthesia: LOCAL

## 2020-04-26 MED ORDER — MORPHINE SULFATE (PF) 2 MG/ML IV SOLN: 2.0000 mg | INTRAVENOUS | Status: DC | PRN

## 2020-04-26 MED ORDER — ONDANSETRON HCL 4 MG/2ML IJ SOLN: 4.0000 mg | Freq: Four times a day (QID) | INTRAMUSCULAR | Status: DC | PRN

## 2020-04-26 MED ORDER — LIDOCAINE HCL (PF) 1 % IJ SOLN
INTRAMUSCULAR | Status: DC | PRN
  Administered 2020-04-26: 5 mL via INTRADERMAL

## 2020-04-26 MED ORDER — NITROGLYCERIN 1 MG/10 ML FOR IR/CATH LAB
INTRA_ARTERIAL | Status: AC
  Filled 2020-04-26: qty 10

## 2020-04-26 MED ORDER — SODIUM CHLORIDE 0.9% FLUSH: 3.0000 mL | Freq: Two times a day (BID) | INTRAVENOUS | Status: DC

## 2020-04-26 MED ORDER — SODIUM CHLORIDE 0.9 % WEIGHT BASED INFUSION
3.0000 mL/kg/h | INTRAVENOUS | Status: DC
  Administered 2020-04-26: 3 mL/kg/h via INTRAVENOUS

## 2020-04-26 MED ORDER — SODIUM CHLORIDE 0.9 % IV SOLN: 250.0000 mL | INTRAVENOUS | Status: DC | PRN

## 2020-04-26 MED ORDER — VERAPAMIL HCL 2.5 MG/ML IV SOLN
INTRA_ARTERIAL | Status: DC | PRN
  Administered 2020-04-26: 15 mL via INTRA_ARTERIAL

## 2020-04-26 MED ORDER — SODIUM CHLORIDE 0.9 % IV SOLN: INTRAVENOUS | Status: DC

## 2020-04-26 MED ORDER — ASPIRIN 81 MG PO CHEW: 81.0000 mg | CHEWABLE_TABLET | ORAL | Status: DC

## 2020-04-26 MED ORDER — MIDAZOLAM HCL 2 MG/2ML IJ SOLN
INTRAMUSCULAR | Status: AC
  Filled 2020-04-26: qty 2

## 2020-04-26 MED ORDER — FENTANYL CITRATE (PF) 100 MCG/2ML IJ SOLN
INTRAMUSCULAR | Status: DC | PRN
  Administered 2020-04-26: 25 ug via INTRAVENOUS

## 2020-04-26 MED ORDER — IOHEXOL 350 MG/ML SOLN
INTRAVENOUS | Status: DC | PRN
  Administered 2020-04-26: 65 mL via INTRA_ARTERIAL

## 2020-04-26 MED ORDER — VERAPAMIL HCL 2.5 MG/ML IV SOLN
INTRAVENOUS | Status: AC
  Filled 2020-04-26: qty 2

## 2020-04-26 MED ORDER — LABETALOL HCL 5 MG/ML IV SOLN: 10.0000 mg | INTRAVENOUS | Status: DC | PRN

## 2020-04-26 MED ORDER — LIDOCAINE HCL (PF) 1 % IJ SOLN
INTRAMUSCULAR | Status: AC
  Filled 2020-04-26: qty 30

## 2020-04-26 MED ORDER — HEPARIN SODIUM (PORCINE) 1000 UNIT/ML IJ SOLN
INTRAMUSCULAR | Status: DC | PRN
  Administered 2020-04-26: 4000 [IU] via INTRAVENOUS

## 2020-04-26 MED ORDER — HEPARIN (PORCINE) IN NACL 1000-0.9 UT/500ML-% IV SOLN
INTRAVENOUS | Status: AC
  Filled 2020-04-26: qty 1000

## 2020-04-26 MED ORDER — HEPARIN (PORCINE) IN NACL 1000-0.9 UT/500ML-% IV SOLN
INTRAVENOUS | Status: DC | PRN
  Administered 2020-04-26 (×2): 500 mL

## 2020-04-26 MED ORDER — HYDRALAZINE HCL 20 MG/ML IJ SOLN: 10.0000 mg | INTRAMUSCULAR | Status: DC | PRN

## 2020-04-26 MED ORDER — SODIUM CHLORIDE 0.9% FLUSH: 3.0000 mL | INTRAVENOUS | Status: DC | PRN

## 2020-04-26 MED ORDER — ACETAMINOPHEN 325 MG PO TABS: 650.0000 mg | ORAL_TABLET | ORAL | Status: DC | PRN

## 2020-04-26 MED ORDER — SODIUM CHLORIDE 0.9 % WEIGHT BASED INFUSION: 1.0000 mL/kg/h | INTRAVENOUS | Status: DC

## 2020-04-26 MED ORDER — FENTANYL CITRATE (PF) 100 MCG/2ML IJ SOLN
INTRAMUSCULAR | Status: AC
  Filled 2020-04-26: qty 2

## 2020-04-26 MED ORDER — MIDAZOLAM HCL 2 MG/2ML IJ SOLN
INTRAMUSCULAR | Status: DC | PRN
  Administered 2020-04-26: 1 mg via INTRAVENOUS

## 2020-04-26 SURGICAL SUPPLY — 12 items
CATH INFINITI 5FR ANG PIGTAIL (CATHETERS) ×2 IMPLANT
CATH OPTITORQUE TIG 4.0 5F (CATHETERS) ×2 IMPLANT
DEVICE RAD COMP TR BAND LRG (VASCULAR PRODUCTS) ×2 IMPLANT
GLIDESHEATH SLEND A-KIT 6F 22G (SHEATH) ×2 IMPLANT
GUIDEWIRE INQWIRE 1.5J.035X260 (WIRE) ×1 IMPLANT
INQWIRE 1.5J .035X260CM (WIRE) ×2
KIT HEART LEFT (KITS) ×2 IMPLANT
PACK CARDIAC CATHETERIZATION (CUSTOM PROCEDURE TRAY) ×2 IMPLANT
SYR MEDRAD MARK 7 150ML (SYRINGE) ×2 IMPLANT
TRANSDUCER W/STOPCOCK (MISCELLANEOUS) ×2 IMPLANT
TUBING CIL FLEX 10 FLL-RA (TUBING) ×2 IMPLANT
WIRE HI TORQ VERSACORE-J 145CM (WIRE) ×2 IMPLANT

## 2020-04-26 NOTE — Telephone Encounter (Signed)
Pt at hospital for procedure. °

## 2020-04-26 NOTE — Discharge Instructions (Signed)
Drink plenty of fluids for 48 hours and keep wrist elevated at heart level for 24 hours  Radial Site Care   This sheet gives you information about how to care for yourself after your procedure. Your health care provider may also give you more specific instructions. If you have problems or questions, contact your health care provider. What can I expect after the procedure? After the procedure, it is common to have:  Bruising and tenderness at the catheter insertion area. Follow these instructions at home: Medicines  Take over-the-counter and prescription medicines only as told by your health care provider. Insertion site care 1. Follow instructions from your health care provider about how to take care of your insertion site. Make sure you: ? Wash your hands with soap and water before you change your bandage (dressing). If soap and water are not available, use hand sanitizer. ? Remove your dressing as told by your health care provider. In 24 hours 2. Check your insertion site every day for signs of infection. Check for: ? Redness, swelling, or pain. ? Fluid or blood. ? Pus or a bad smell. ? Warmth. 3. Do not take baths, swim, or use a hot tub until your health care provider approves. 4. You may shower 24-48 hours after the procedure, or as directed by your health care provider. ? Remove the dressing and gently wash the site with plain soap and water. ? Pat the area dry with a clean towel. ? Do not rub the site. That could cause bleeding. 5. Do not apply powder or lotion to the site. Activity   1. For 24 hours after the procedure, or as directed by your health care provider: ? Do not flex or bend the affected arm. ? Do not push or pull heavy objects with the affected arm. ? Do not drive yourself home from the hospital or clinic. You may drive 24 hours after the procedure unless your health care provider tells you not to. ? Do not operate machinery or power tools. 2. Do not lift  anything that is heavier than 10 lb (4.5 kg), or the limit that you are told, until your health care provider says that it is safe.  For 4 days 3. Ask your health care provider when it is okay to: ? Return to work or school. ? Resume usual physical activities or sports. ? Resume sexual activity. General instructions  If the catheter site starts to bleed, raise your arm and put firm pressure on the site. If the bleeding does not stop, get help right away. This is a medical emergency.  If you went home on the same day as your procedure, a responsible adult should be with you for the first 24 hours after you arrive home.  Keep all follow-up visits as told by your health care provider. This is important. Contact a health care provider if:  You have a fever.  You have redness, swelling, or yellow drainage around your insertion site. Get help right away if:  You have unusual pain at the radial site.  The catheter insertion area swells very fast.  The insertion area is bleeding, and the bleeding does not stop when you hold steady pressure on the area.  Your arm or hand becomes pale, cool, tingly, or numb. These symptoms may represent a serious problem that is an emergency. Do not wait to see if the symptoms will go away. Get medical help right away. Call your local emergency services (911 in the [place] Do   not drive yourself to the hospital. Summary  After the procedure, it is common to have bruising and tenderness at the site.  Follow instructions from your health care provider about how to take care of your radial site wound. Check the wound every day for signs of infection.  Do not lift anything that is heavier than 10 lb (4.5 kg), or the limit that you are told, until your health care provider says that it is safe. This information is not intended to replace advice given to you by your health care provider. Make sure you discuss any questions you have with your health care  provider. Document Revised: 12/09/2017 Document Reviewed: 12/09/2017 Elsevier Patient Education  2020 Elsevier Inc.  

## 2020-04-26 NOTE — Telephone Encounter (Signed)
Pt c/o medication issue:  1. Name of Medication: metoprolol tartrate (LOPRESSOR) 25 MG tablet  2. How are you currently taking this medication (dosage and times per day)? Twice a day  3. Are you having a reaction (difficulty breathing--STAT)? no  4. What is your medication issue? Patient states he did not take the medication today, because of his procedure. He states he would like to know if he is okay to stop taking the medication now or wait until his appointment 6/21.

## 2020-04-26 NOTE — Interval H&P Note (Signed)
Cath Lab Visit (complete for each Cath Lab visit)  Clinical Evaluation Leading to the Procedure:   ACS: No.  Non-ACS:    Anginal Classification: CCS II  Anti-ischemic medical therapy: Minimal Therapy (1 class of medications)  Non-Invasive Test Results: No non-invasive testing performed  Prior CABG: No previous CABG      History and Physical Interval Note:  04/26/2020 10:58 AM  Andrew Delgado  has presented today for surgery, with the diagnosis of chest pain.  The various methods of treatment have been discussed with the patient and family. After consideration of risks, benefits and other options for treatment, the patient has consented to  Procedure(s): LEFT HEART CATH AND CORONARY ANGIOGRAPHY (N/A) as a surgical intervention.  The patient's history has been reviewed, patient examined, no change in status, stable for surgery.  I have reviewed the patient's chart and labs.  Questions were answered to the patient's satisfaction.     Nanetta Batty

## 2020-04-26 NOTE — Telephone Encounter (Signed)
Spoke with patient. Patient started on metoprolol at last office visit. Patient wants to know if he should continue or if it was only to be taken until his cath procedure. No instructions to stop listed on AVS. Patient will continue medication until his follow up appointment and discuss with Dr. Bjorn Pippin at that time. No adverse affects noted at this time.

## 2020-05-07 ENCOUNTER — Telehealth: Payer: Self-pay | Admitting: Cardiology

## 2020-05-07 ENCOUNTER — Other Ambulatory Visit: Payer: Self-pay

## 2020-05-07 ENCOUNTER — Ambulatory Visit: Payer: 59 | Admitting: Cardiology

## 2020-05-07 VITALS — BP 125/70 | HR 88 | Temp 97.9°F | Ht 68.0 in | Wt 188.6 lb

## 2020-05-07 DIAGNOSIS — I10 Essential (primary) hypertension: Secondary | ICD-10-CM

## 2020-05-07 DIAGNOSIS — R079 Chest pain, unspecified: Secondary | ICD-10-CM | POA: Diagnosis not present

## 2020-05-07 MED ORDER — METOPROLOL SUCCINATE ER 50 MG PO TB24
50.0000 mg | ORAL_TABLET | Freq: Every day | ORAL | 3 refills | Status: DC
Start: 2020-05-07 — End: 2020-05-14

## 2020-05-07 NOTE — Patient Instructions (Signed)
Medication Instructions:  STOP metoprolol tartrate (Lopressor)  START metoprolol succinate (Toprol XL) 50 mg daily  *If you need a refill on your cardiac medications before your next appointment, please call your pharmacy*  Follow-Up: At [facility], you and your health needs are our priority.  As part of our continuing mission to provide you with exceptional heart care, we have created designated Provider Care Teams.  These Care Teams include your primary Cardiologist (physician) and Advanced Practice Providers (APPs -  Physician Assistants and Nurse Practitioners) who all work together to provide you with the care you need, when you need it.  We recommend signing up for the patient portal called "MyChart".  Sign up information is provided on this After Visit Summary.  MyChart is used to connect with patients for Virtual Visits (Telemedicine).  Patients are able to view lab/test results, encounter notes, upcoming appointments, etc.  Non-urgent messages can be sent to your provider as well.   To learn more about what you can do with MyChart, go to [email or web address].    Your next appointment:   3 month(s)  The format for your next appointment:   In Person  Provider:   Epifanio Lesches, MD

## 2020-05-07 NOTE — Telephone Encounter (Signed)
*  STAT* If patient is at the pharmacy, call can be transferred to refill team.   1. Which medications need to be refilled? (please list name of each medication and dose if known) metoprolol succinate (TOPROL-XL) 50 MG 24 hr tablet  2. Which pharmacy/location (including street and city if local pharmacy) is medication to be sent to? [id number] - [place], [place] - [address]  3. Do they need a 30 day or 90 day supply? 90  Patient states the pharmacy still has not received this medication.

## 2020-05-07 NOTE — Progress Notes (Signed)
Cardiology Office Note:    Date:  05/09/2020   ID:  Andrew Delgado, DOB Dec 17, 1975, MRN [id number]  PCP:  Daisy Floro, MD  Cardiologist:  No primary care provider on file.  Electrophysiologist:  None   Referring MD: Daisy Floro, MD   Chief Complaint  Patient presents with  . Chest Pain     History of Present Illness:    Andrew Delgado is a 44 y.o. male with a hx of asthma, hypertension who presents for follow-up.  He was referred by Dr. Tenny Craw for evaluation of chest pain, initially seen on 04/23/2020 he reports that he has been having intermittent chest pain since 2017 but has progressed over the prior 2 weeks.  Reports that on 05/16/2020 he went for a hike with his wife.  Hiked for total of about 10 miles and on the way back began having left-sided chest pain.  Described as sharp but sometimes felt more like a squeezing pain over the left side of his chest that occurred while walking.  He got to his car and pain eventually subsided.  Lasted for about 15 to 20 minutes.  The following day he went for his usual 2 to 4 mile walk and reported the onset of the same chest pain.  States that he stopped walking and pain subsided.  He has not exercised since that time, but states that he had onset of chest pain with walking around grocery store last week.  Reports BP has been under good control.  He smoked 1.5 to 2 packs/day x 20 years, quit in 2012.  Family history includes mother had MI at age 76 and father died of CVA in 2s.  Cardiac catheterization on 04/26/2020 showed normal coronary arteries, normal LV systolic function, normal LVEDP.    Since last clinic visit, reports that he continues to have chest pain.  Describes left-sided chest pain that can occur at rest or with exertion.  He does note since running metoprolol he seems to have had some improvement in his symptoms.   Past Medical History:  Diagnosis Date  . Asthma    as a child  . Family history of adverse  reaction to anesthesia    it takes my mother a long time to come out of anesthesia and PONV  . Headache   . Hypertension   . Hypoglycemic reaction    history"hypoglycemic"  . Pneumonia    Pneumonia- severe case 4'16 (multiple rounds oral antibiotics).Using Inhaler daily.Occ. residual cough.  Marland Kitchen PONV (postoperative nausea and vomiting)   . Umbilical hernia   . Wears glasses     Past Surgical History:  Procedure Laterality Date  . ADENOIDECTOMY    . CIRCUMCISION    . INSERTION OF MESH N/A 07/13/2015   Procedure: INSERTION OF MESH;  Surgeon: Axel Filler, MD;  Location: [facility] OR;  Service: General;  Laterality: N/A;  . LEFT HEART CATH AND CORONARY ANGIOGRAPHY N/A 04/26/2020   Procedure: LEFT HEART CATH AND CORONARY ANGIOGRAPHY;  Surgeon: Runell Gess, MD;  Location: [facility] INVASIVE CV LAB;  Service: Cardiovascular;  Laterality: N/A;  . MYRINGOTOMY WITH TUBE PLACEMENT     x2 - 30 yrs ago  . UMBILICAL HERNIA REPAIR N/A 07/13/2015   Procedure: LAPAROSCOPIC UMBILICAL HERNIA REPAIR WITH MESH  X 2 ;  Surgeon: Axel Filler, MD;  Location: [facility] OR;  Service: General;  Laterality: N/A;  . WISDOM TOOTH EXTRACTION      Current Medications: Current Meds  Medication  Sig  . aspirin EC 81 MG tablet Take 1 tablet (81 mg total) by mouth daily.  Marland Kitchen azelastine (ASTELIN) 0.1 % nasal spray Place 1 spray into both nostrils 2 (two) times daily. Use in each nostril as directed  . budesonide-formoterol (SYMBICORT) 160-4.5 MCG/ACT inhaler Inhale 2 puffs into the lungs 2 (two) times daily.  . Calcium Carb-Cholecalciferol (CALCIUM 600 + D PO) Take 1 tablet by mouth daily.  . Cholecalciferol (VITAMIN D) 50 MCG (2000 UT) tablet Take 2,000 Units by mouth daily.  Marland Kitchen Dextromethorphan-guaiFENesin (MUCINEX DM MAXIMUM STRENGTH) 60-1200 MG TB12 Take 1 tablet by mouth 2 (two) times daily as needed (congestion).  . fexofenadine (ALLEGRA) 180 MG tablet Take 180 mg by mouth daily.  . fluticasone (FLONASE) 50 MCG/ACT nasal  spray Place 1 spray into both nostrils in the morning and at bedtime.   . Multiple Vitamin (MULTIVITAMIN WITH MINERALS) TABS tablet Take 1 tablet by mouth daily.  . nitroGLYCERIN (NITROSTAT) 0.4 MG SL tablet Place 1 tablet (0.4 mg total) under the tongue every 5 (five) minutes as needed for chest pain.  Marland Kitchen telmisartan (MICARDIS) 80 MG tablet Take 80 mg by mouth daily.  . traZODone (DESYREL) 150 MG tablet Take 150 mg by mouth at bedtime.  Marland Kitchen zolpidem (AMBIEN) 10 MG tablet Take 10 mg by mouth at bedtime as needed for sleep.  . [DISCONTINUED] metoprolol tartrate (LOPRESSOR) 25 MG tablet Take 1 tablet (25 mg total) by mouth 2 (two) times daily.   Current Facility-Administered Medications for the 05/07/20 encounter (Office Visit) with Donato Heinz, MD  Medication  . sodium chloride flush (NS) 0.9 % injection 3 mL     Allergies:   Chantix [varenicline] and Prednisone   Social History   Socioeconomic History  . Marital status: Married    Spouse name: Erasmo Downer  . Number of children: 5  . Years of education: Not on file  . Highest education level: Some college, no degree  Occupational History  . Occupation: Engineer, structural  Tobacco Use  . Smoking status: Former Smoker    Packs/day: 1.00    Years: 20.00    Pack years: 20.00    Types: Cigarettes    Quit date: 11/17/2010    Years since quitting: 9.4  . Smokeless tobacco: Former Systems developer    Types: Secondary school teacher  . Vaping Use: Never used  Substance and Sexual Activity  . Alcohol use: Yes    Alcohol/week: 0.0 standard drinks    Comment: occ.  . Drug use: No  . Sexual activity: Not on file  Other Topics Concern  . Not on file  Social History Narrative   Pt right handed, he is married, lives with wife and 5 sons in a 2 story house. He drinks 1-2 cups of coffee on the weekends. He runs and bicycles 3-4 x a week.   Social Determinants of Health   Financial Resource Strain:   . Difficulty of Paying Living Expenses:   Food  Insecurity:   . Worried About Charity fundraiser in the Last Year:   . Arboriculturist in the Last Year:   Transportation Needs:   . Film/video editor (Medical):   Marland Kitchen Lack of Transportation (Non-Medical):   Physical Activity:   . Days of Exercise per Week:   . Minutes of Exercise per Session:   Stress:   . Feeling of Stress :   Social Connections:   . Frequency of Communication with Friends and Family:   .  Frequency of Social Gatherings with Friends and Family:   . Attends Religious Services:   . Active Member of Clubs or Organizations:   . Attends Banker Meetings:   Marland Kitchen Marital Status:      Family History: The patient's family history includes Allergies in his sister; Anuerysm in his father; Heart disease in his mother.  ROS:   Please see the history of present illness.    All other systems reviewed and are negative.  EKGs/Labs/Other Studies Reviewed:    The following studies were reviewed today:   EKG:  EKG is ordered today.  The ekg ordered today demonstrates normal sinus rhythm, rate 88, no ST/T abnormalities  Recent Labs: 04/23/2020: BUN 11; Creatinine, Ser 0.80; Hemoglobin 16.2; Platelets 196; Potassium 4.9; Sodium 144  Recent Lipid Panel No results found for: CHOL, TRIG, HDL, CHOLHDL, VLDL, LDLCALC, LDLDIRECT  Physical Exam:    VS:  BP 125/70   Pulse 88   Temp 97.9 F (36.6 C)   Ht 5\' 8"  (1.727 m)   Wt 188 lb 9.6 oz (85.5 kg)   SpO2 97%   BMI 28.68 kg/m     Wt Readings from Last 3 Encounters:  05/07/20 188 lb 9.6 oz (85.5 kg)  04/26/20 (P) 176 lb (79.8 kg)  04/23/20 187 lb 3.2 oz (84.9 kg)     GEN: Well nourished, well developed in no acute distress HEENT: Normal NECK: No JVD; No carotid bruits LYMPHATICS: No lymphadenopathy CARDIAC: RRR, no murmurs, rubs, gallops RESPIRATORY:  Clear to auscultation without rales, wheezing or rhonchi  ABDOMEN: Soft, non-tender, non-distended MUSCULOSKELETAL:  No edema; No deformity  SKIN: Warm and  dry NEUROLOGIC:  Alert and oriented x 3 PSYCHIATRIC:  Normal affect   ASSESSMENT:    1. Chest pain of uncertain etiology   2. Essential hypertension    PLAN:    Chest pain: describes exertional chest pain that resolves with rest.  He does have significant CAD risk factors (hypertension, tobacco use, family history).  Cardiac catheterization shows normal coronary arteries.  Differential includes microvascular disease versus noncardiac chest pain, likely musculoskeletal. -Given reported improvement in chest pain with metoprolol, may represent MVD.  Will change beta-blocker to Toprol-XL 50 mg daily -Recommend PCP follow-up to evaluate for noncardiac causes of chest pain  Hypertension: On telmisartan 80 mg daily and metoprolol 25 mg twice daily.  Appears controlled.   RTC in 2 weeks   Medication Adjustments/Labs and Tests Ordered: Current medicines are reviewed at length with the patient today.  Concerns regarding medicines are outlined above.  Orders Placed This Encounter  Procedures  . EKG 12-Lead   Meds ordered this encounter  Medications  . metoprolol succinate (TOPROL-XL) 50 MG 24 hr tablet    Sig: Take 1 tablet (50 mg total) by mouth daily. Take with or immediately following a meal.    Dispense:  90 tablet    Refill:  3    Stop lopressor    Patient Instructions  Medication Instructions:  STOP metoprolol tartrate (Lopressor)  START metoprolol succinate (Toprol XL) 50 mg daily  *If you need a refill on your cardiac medications before your next appointment, please call your pharmacy*  Follow-Up: At [facility], you and your health needs are our priority.  As part of our continuing mission to provide you with exceptional heart care, we have created designated Provider Care Teams.  These Care Teams include your primary Cardiologist (physician) and Advanced Practice Providers (APPs -  Physician Assistants and Nurse Practitioners)  who all work together to provide you with the  care you need, when you need it.  We recommend signing up for the patient portal called "MyChart".  Sign up information is provided on this After Visit Summary.  MyChart is used to connect with patients for Virtual Visits (Telemedicine).  Patients are able to view lab/test results, encounter notes, upcoming appointments, etc.  Non-urgent messages can be sent to your provider as well.   To learn more about what you can do with MyChart, go to [email or web address].    Your next appointment:   3 month(s)  The format for your next appointment:   In Person  Provider:   Epifanio Lesches, MD        Signed, Little Ishikawa, MD  05/09/2020 5:13 PM    [facility]

## 2020-05-11 ENCOUNTER — Telehealth: Payer: Self-pay | Admitting: Cardiology

## 2020-05-11 NOTE — Telephone Encounter (Addendum)
Called and spoke with pt, reviewed Dr.Schumann's recommendations of only taking half of his 50mg  tablet (25mg  total). Pt asking how long he should give the medication, notified it was fine for him to take it today and tomorrow and if he still felt loopy to let know. Advised that we have a provider on call and he can speak with them if needed. Pt verbalized understanding and stated he would go out and get a pill cutter right not. No other questions at this time.

## 2020-05-11 NOTE — Telephone Encounter (Signed)
Recommend decreasing metoprolol to 25 mg daily

## 2020-05-11 NOTE — Telephone Encounter (Signed)
Pt c/o medication issue:  1. Name of Medication: metoprolol succinate (TOPROL-XL) 50 MG 24 hr tablet  2. How are you currently taking this medication (dosage and times per day)? As directed  3. Are you having a reaction (difficulty breathing--STAT)? yes  4. What is your medication issue? Patient states that this medication is making him loopy for several hours after taking it and leaves his fatigued all day long.

## 2020-05-11 NOTE — Telephone Encounter (Signed)
Called and spoke with pt, states that he began taking this metoprolol on Tuesday and it is causing him to feel "loopy". Pt reports that about 2 hours after taking the medicine he feels loopy and if he isn't loopy he is extremely fatigued. States the other day he could barely get off the couch. He reports that it makes him feel "drugged up" while taking it. Pt reports that it makes him feel lightheaded and he stares off at the walls. Pt has a way to check his BP but did not take it for the last few days. Pt checked BP while on the phone with me and was 121/76 Pt denies any other symptoms. Pt has not taken his medication today. Notified to hold this med for the time being and I would send this message to Dr.Schumann for review. Pt verbalized understanding and had no other questions at this time.

## 2020-05-14 MED ORDER — METOPROLOL SUCCINATE ER 50 MG PO TB24
25.0000 mg | ORAL_TABLET | Freq: Every day | ORAL | 3 refills | Status: DC
Start: 2020-05-14 — End: 2020-08-07

## 2020-05-14 NOTE — Addendum Note (Signed)
Addended by: Johney Frame A on: 05/14/2020 09:39 AM   Modules accepted: Orders

## 2020-05-22 ENCOUNTER — Telehealth: Payer: Self-pay | Admitting: Cardiology

## 2020-05-22 NOTE — Telephone Encounter (Signed)
Pt c/o medication issue:  1. Name of Medication: metoprolol succinate (TOPROL-XL) 50 MG 24 hr tablet  2. How are you currently taking this medication (dosage and times per day)? Half a tablet by mouth daily   3. Are you having a reaction (difficulty breathing--STAT)? Yes   4. What is your medication issue? Andrew Delgado is calling stating this medication is still causing him to have "loopy spells" to the point he cannot drive. He states he would like to know if this is something he will just have to learn to deal with or if he can be switched to an alternative medication due to this. Please advise.

## 2020-05-22 NOTE — Telephone Encounter (Signed)
Spoke with pt, his dose was cut in 1/2 about week or so ago. He continues to feel "loopy" and is scared to drive. Patient will try taking the metoprolol in the evening. He will call back if continues to have problems.

## 2020-06-06 ENCOUNTER — Telehealth: Payer: Self-pay | Admitting: Cardiology

## 2020-06-06 NOTE — Telephone Encounter (Signed)
Given cardiac catheterization was normal, he does not have any restrictions from a cardiac standpoint.  If he continues to have chest pain, would recommend discussing with PCP to explore noncardiac causes.

## 2020-06-06 NOTE — Telephone Encounter (Signed)
Called patient- he states that he is still having some issues-  The chest pain has improved, but it is still there and he notices it. He does not want to hinder his progress yet since he is doing better, but thinks he could use more time to get heal and get the medication working better. 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 I advised that Dr.Schumann was in clinic today but we would let him know if another appointment was needed, or if another letter could be written and we would let him know. Patient verbalized understanding, thankful for call.

## 2020-06-06 NOTE — Telephone Encounter (Signed)
Patient is calling for a letter stating that his is under Dr. Bjorn Pippin care and is still under restricted duty.

## 2020-06-07 ENCOUNTER — Telehealth: Payer: Self-pay | Admitting: Cardiology

## 2020-06-07 NOTE — Telephone Encounter (Addendum)
Letter created  Patient aware and will scan to email per request: [email or web address]

## 2020-06-07 NOTE — Telephone Encounter (Signed)
Duplicate phone note-see other encounter 

## 2020-06-07 NOTE — Telephone Encounter (Signed)
Spoke to patient-he states when he was here last time Dr. 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   He is improving on the medication but still not 100%.   He states he was told by Dr. Bjorn Pippin at last OV if he was still having chest pain he would extend his light duty for a little longer.   He states the chest pain is definitely improving on the medication and believes he would benefit from 2-3 more weeks of light duty.   He has not seen Dr. Tenny Craw since his heart catherization but states he has a physical coming up in the next 1-2 months.     Routed to MD to review

## 2020-06-07 NOTE — Telephone Encounter (Signed)
That is fine, can extend light duty

## 2020-06-07 NOTE — Telephone Encounter (Signed)
i just got off the phone with pt and he said that he called yesterday and he said he needs some documentation from dr. Bjorn Pippin. Please call to discuss.

## 2020-07-24 ENCOUNTER — Ambulatory Visit: Payer: 59 | Admitting: Neurology

## 2020-07-24 NOTE — Progress Notes (Deleted)
NEUROLOGY FOLLOW UP OFFICE NOTE  Andrew Delgado [id number]  HISTORY OF PRESENT ILLNESS: Andrew Delgado is a 44 year old male who follows up for headache.  UPDATE: Prescribed indomethacin as needed.  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  They are typically mild and cause brief pressure with such activities.  They typically occur while working out.  In August 2020, he had a severe attack while holding his breath while weightlifting.  He developed a severe burning pain from his right parietal region to the right eye.  It lasted 2 to 3 hours but had a dull headache for the next 2 weeks.  No nausea, vomiting, visual disturbance, dizziness, photophobia, phonophobia, numbness or weakness.  He had two subsequent mild episodes but still lasting 2 days.  He hasn't had an episode since November.  Excedrin does not seem to help.  In September 2020, exertional activity.  Weight lifting holding breath.  Right parietal region to eye 2 to 3 hours.  for dull 2 weeks.  It happened once or twice November.  Deep breathing helps.  2 days   Dizziness: In February 2019, he was sitting at a conference when he suddenly felt dizzy. It is a sensation of movement. There may be some associated mild nausea and slight pressure behind the right eye. He didn't have any preceding event such as viral illness or head injury. A few days later, he had severe episode lasting an entire day with spinning and nausea but no vomiting, in which he was incapacitated. At some point, he gets dizzy almost daily. It occurs spontaneously but it may be triggered with quick head movement or gaze to the left, walking down the aile at the grocery store or at night while driving and headlights are speeding by him. Other than the slight pressure, there is no headache. He denies hearing loss, tinnitus or unilateral numbness or weakness.  MRI of brain without  contrast performed on 01/25/18 was normal.  He was seen in the ED on 11/30/2018 for episode of dizziness and right eye pressure.  CTA of head and neck were unremarkable.   PAST MEDICAL HISTORY: Past Medical History:  Diagnosis Date  . Asthma    as a child  . Family history of adverse reaction to anesthesia    it takes my mother a long time to come out of anesthesia and PONV  . Headache   . Hypertension   . Hypoglycemic reaction    history"hypoglycemic"  . Pneumonia    Pneumonia- severe case 4'16 (multiple rounds oral antibiotics).Using Inhaler daily.Occ. residual cough.  Marland Kitchen PONV (postoperative nausea and vomiting)   . Umbilical hernia   . Wears glasses     MEDICATIONS: Current Outpatient Medications on File Prior to Visit  Medication Sig Dispense Refill  . aspirin EC 81 MG tablet Take 1 tablet (81 mg total) by mouth daily. 90 tablet 3  . azelastine (ASTELIN) 0.1 % nasal spray Place 1 spray into both nostrils 2 (two) times daily. Use in each nostril as directed    . budesonide-formoterol (SYMBICORT) 160-4.5 MCG/ACT inhaler Inhale 2 puffs into the lungs 2 (two) times daily.    . Calcium Carb-Cholecalciferol (CALCIUM 600 + D PO) Take 1 tablet by mouth daily.    . Cholecalciferol (VITAMIN D) 50 MCG (2000 UT) tablet Take 2,000 Units by mouth daily.    Marland Kitchen Dextromethorphan-guaiFENesin (MUCINEX DM MAXIMUM STRENGTH) 60-1200 MG TB12  Take 1 tablet by mouth 2 (two) times daily as needed (congestion).    . fexofenadine (ALLEGRA) 180 MG tablet Take 180 mg by mouth daily.    . fluticasone (FLONASE) 50 MCG/ACT nasal spray Place 1 spray into both nostrils in the morning and at bedtime.     . metoprolol succinate (TOPROL-XL) 50 MG 24 hr tablet Take 0.5 tablets (25 mg total) by mouth daily. Take with or immediately following a meal. 90 tablet 3  . Multiple Vitamin (MULTIVITAMIN WITH MINERALS) TABS tablet Take 1 tablet by mouth daily.    . nitroGLYCERIN (NITROSTAT) 0.4 MG SL tablet Place 1 tablet (0.4 mg  total) under the tongue every 5 (five) minutes as needed for chest pain. 25 tablet 3  . telmisartan (MICARDIS) 80 MG tablet Take 80 mg by mouth daily.    . traZODone (DESYREL) 150 MG tablet Take 150 mg by mouth at bedtime.    Marland Kitchen zolpidem (AMBIEN) 10 MG tablet Take 10 mg by mouth at bedtime as needed for sleep.     Current Facility-Administered Medications on File Prior to Visit  Medication Dose Route Frequency Provider Last Rate Last Admin  . sodium chloride flush (NS) 0.9 % injection 3 mL  3 mL Intravenous Q12H Andrew Ishikawa, MD        ALLERGIES: Allergies  Allergen Reactions  . Chantix [Varenicline]     agitation  . Prednisone     aggressive    FAMILY HISTORY: Family History  Problem Relation Age of Onset  . Allergies Sister   . Heart disease Mother   . Anuerysm Father    ***.  SOCIAL HISTORY: Social History   Socioeconomic History  . Marital status: Married    Spouse name: Andrew Delgado  . Number of children: 5  . Years of education: Not on file  . Highest education level: Some college, no degree  Occupational History  . Occupation: Emergency planning/management officer  Tobacco Use  . Smoking status: Former Smoker    Packs/day: 1.00    Years: 20.00    Pack years: 20.00    Types: Cigarettes    Quit date: 11/17/2010    Years since quitting: 9.6  . Smokeless tobacco: Former Neurosurgeon    Types: Engineer, drilling  . Vaping Use: Never used  Substance and Sexual Activity  . Alcohol use: Yes    Alcohol/week: 0.0 standard drinks    Comment: occ.  . Drug use: No  . Sexual activity: Not on file  Other Topics Concern  . Not on file  Social History Narrative   Pt right handed, he is married, lives with wife and 5 sons in a 2 story house. He drinks 1-2 cups of coffee on the weekends. He runs and bicycles 3-4 x a week.   Social Determinants of Health   Financial Resource Strain:   . Difficulty of Paying Living Expenses: Not on file  Food Insecurity:   . Worried About Programme researcher, broadcasting/film/video  in the Last Year: Not on file  . Ran Out of Food in the Last Year: Not on file  Transportation Needs:   . Lack of Transportation (Medical): Not on file  . Lack of Transportation (Non-Medical): Not on file  Physical Activity:   . Days of Exercise per Week: Not on file  . Minutes of Exercise per Session: Not on file  Stress:   . Feeling of Stress : Not on file  Social Connections:   . Frequency of Communication with  Friends and Family: Not on file  . Frequency of Social Gatherings with Friends and Family: Not on file  . Attends Religious Services: Not on file  . Active Member of Clubs or Organizations: Not on file  . Attends Banker Meetings: Not on file  . Marital Status: Not on file  Intimate Partner Violence:   . Fear of Current or Ex-Partner: Not on file  . Emotionally Abused: Not on file  . Physically Abused: Not on file  . Sexually Abused: Not on file   PHYSICAL EXAM: *** General: No acute distress.  Patient appears ***-groomed.   Head:  Normocephalic/atraumatic Eyes:  Fundi examined but not visualized Neck: supple, no paraspinal tenderness, full range of motion Heart:  Regular rate and rhythm Lungs:  Clear to auscultation bilaterally Back: No paraspinal tenderness Neurological Exam: alert and oriented to person, place, and time. Attention span and concentration intact, recent and remote memory intact, fund of knowledge intact.  Speech fluent and not dysarthric, language intact.  CN II-XII intact. Bulk and tone normal, muscle strength 5/5 throughout.  Sensation to light touch, temperature and vibration intact.  Deep tendon reflexes 2+ throughout, toes downgoing.  Finger to nose and heel to shin testing intact.  Gait normal, Romberg negative.  IMPRESSION: ***  PLAN: ***  Andrew Millet, DO  CC: ***

## 2020-08-03 NOTE — Progress Notes (Signed)
Cardiology Office Note:    Date:  08/07/2020   ID:  Andrew Delgado, DOB 29-May-1976, MRN [id number]  PCP:  Daisy Floro, MD  Cardiologist:  No primary care provider on file.  Electrophysiologist:  None   Referring MD: Daisy Floro, MD   Chief Complaint  Patient presents with  . Chest Pain     History of Present Illness:    Andrew Delgado is a 44 y.o. male with a hx of asthma, hypertension who presents for follow-up.  He was referred by Dr. Tenny Craw for evaluation of chest pain, initially seen on 04/23/2020 he reports that he has been having intermittent chest pain since 2017 but has progressed over the prior 2 weeks.  Reports that on 05/16/2020 he went for a hike with his wife.  Hiked for total of about 10 miles and on the way back began having left-sided chest pain.  Described as sharp but sometimes felt more like a squeezing pain over the left side of his chest that occurred while walking.  He got to his car and pain eventually subsided.  Lasted for about 15 to 20 minutes.  The following day he went for his usual 2 to 4 mile walk and reported the onset of the same chest pain.  States that he stopped walking and pain subsided.  He has not exercised since that time, but states that he had onset of chest pain with walking around grocery store last week.  Reports BP has been under good control.  He smoked 1.5 to 2 packs/day x 20 years, quit in 2012.  Family history includes mother had MI at age 82 and father died of CVA in 37s.  Cardiac catheterization on 04/26/2020 showed normal coronary arteries, normal LV systolic function, normal LVEDP.    Since last clinic visit, he reports that he is doing well.  States that his chest pain has significantly improved.  States that he currently has chest pain about once every few weeks that lasts for a few minutes.  Can occur at rest or with exertion.  Ran 4 miles 2 days ago with no issues.    Past Medical History:  Diagnosis Date  .  Asthma    as a child  . Family history of adverse reaction to anesthesia    it takes my mother a long time to come out of anesthesia and PONV  . Headache   . Hypertension   . Hypoglycemic reaction    history"hypoglycemic"  . Pneumonia    Pneumonia- severe case 4'16 (multiple rounds oral antibiotics).Using Inhaler daily.Occ. residual cough.  Marland Kitchen PONV (postoperative nausea and vomiting)   . Umbilical hernia   . Wears glasses     Past Surgical History:  Procedure Laterality Date  . ADENOIDECTOMY    . CIRCUMCISION    . INSERTION OF MESH N/A 07/13/2015   Procedure: INSERTION OF MESH;  Surgeon: Axel Filler, MD;  Location: [facility] OR;  Service: General;  Laterality: N/A;  . LEFT HEART CATH AND CORONARY ANGIOGRAPHY N/A 04/26/2020   Procedure: LEFT HEART CATH AND CORONARY ANGIOGRAPHY;  Surgeon: Runell Gess, MD;  Location: [facility] INVASIVE CV LAB;  Service: Cardiovascular;  Laterality: N/A;  . MYRINGOTOMY WITH TUBE PLACEMENT     x2 - 30 yrs ago  . UMBILICAL HERNIA REPAIR N/A 07/13/2015   Procedure: LAPAROSCOPIC UMBILICAL HERNIA REPAIR WITH MESH  X 2 ;  Surgeon: Axel Filler, MD;  Location: [facility] OR;  Service: General;  Laterality: N/A;  .  WISDOM TOOTH EXTRACTION      Current Medications: Current Meds  Medication Sig  . aspirin EC 81 MG tablet Take 1 tablet (81 mg total) by mouth daily.  Marland Kitchen azelastine (ASTELIN) 0.1 % nasal spray Place 1 spray into both nostrils 2 (two) times daily. Use in each nostril as directed  . budesonide-formoterol (SYMBICORT) 160-4.5 MCG/ACT inhaler Inhale 2 puffs into the lungs 2 (two) times daily.  . Calcium Carb-Cholecalciferol (CALCIUM 600 + D PO) Take 1 tablet by mouth daily.  . Cholecalciferol (VITAMIN D) 50 MCG (2000 UT) tablet Take 2,000 Units by mouth daily.  Marland Kitchen Dextromethorphan-guaiFENesin (MUCINEX DM MAXIMUM STRENGTH) 60-1200 MG TB12 Take 1 tablet by mouth 2 (two) times daily as needed (congestion).  . fexofenadine (ALLEGRA) 180 MG tablet Take 180 mg by mouth  daily.  . fluticasone (FLONASE) 50 MCG/ACT nasal spray Place 1 spray into both nostrils in the morning and at bedtime.   . Multiple Vitamin (MULTIVITAMIN WITH MINERALS) TABS tablet Take 1 tablet by mouth daily.  Marland Kitchen telmisartan (MICARDIS) 80 MG tablet Take 80 mg by mouth daily.  . traZODone (DESYREL) 150 MG tablet Take 150 mg by mouth at bedtime.  Marland Kitchen zolpidem (AMBIEN) 10 MG tablet Take 10 mg by mouth at bedtime as needed for sleep.  . [DISCONTINUED] metoprolol succinate (TOPROL-XL) 50 MG 24 hr tablet Take 0.5 tablets (25 mg total) by mouth daily. Take with or immediately following a meal.   Current Facility-Administered Medications for the 08/07/20 encounter (Office Visit) with Little Ishikawa, MD  Medication  . sodium chloride flush (NS) 0.9 % injection 3 mL     Allergies:   Chantix [varenicline] and Prednisone   Social History   Socioeconomic History  . Marital status: Married    Spouse name: Belenda Cruise  . Number of children: 5  . Years of education: Not on file  . Highest education level: Some college, no degree  Occupational History  . Occupation: Emergency planning/management officer  Tobacco Use  . Smoking status: Former Smoker    Packs/day: 1.00    Years: 20.00    Pack years: 20.00    Types: Cigarettes    Quit date: 11/17/2010    Years since quitting: 9.7  . Smokeless tobacco: Former Neurosurgeon    Types: Engineer, drilling  . Vaping Use: Never used  Substance and Sexual Activity  . Alcohol use: Yes    Alcohol/week: 0.0 standard drinks    Comment: occ.  . Drug use: No  . Sexual activity: Not on file  Other Topics Concern  . Not on file  Social History Narrative   Pt right handed, he is married, lives with wife and 5 sons in a 2 story house. He drinks 1-2 cups of coffee on the weekends. He runs and bicycles 3-4 x a week.   Social Determinants of Health   Financial Resource Strain:   . Difficulty of Paying Living Expenses: Not on file  Food Insecurity:   . Worried About Programme researcher, broadcasting/film/video  in the Last Year: Not on file  . Ran Out of Food in the Last Year: Not on file  Transportation Needs:   . Lack of Transportation (Medical): Not on file  . Lack of Transportation (Non-Medical): Not on file  Physical Activity:   . Days of Exercise per Week: Not on file  . Minutes of Exercise per Session: Not on file  Stress:   . Feeling of Stress : Not on file  Social Connections:   .  Frequency of Communication with Friends and Family: Not on file  . Frequency of Social Gatherings with Friends and Family: Not on file  . Attends Religious Services: Not on file  . Active Member of Clubs or Organizations: Not on file  . Attends Banker Meetings: Not on file  . Marital Status: Not on file     Family History: The patient's family history includes Allergies in his sister; Anuerysm in his father; Heart disease in his mother.  ROS:   Please see the history of present illness.    All other systems reviewed and are negative.  EKGs/Labs/Other Studies Reviewed:    The following studies were reviewed today:   EKG:  EKG is not ordered today.  The ekg ordered at recent clinic visit demonstrates normal sinus rhythm, rate 88, no ST/T abnormalities  Recent Labs: 04/23/2020: BUN 11; Creatinine, Ser 0.80; Hemoglobin 16.2; Platelets 196; Potassium 4.9; Sodium 144  Recent Lipid Panel No results found for: CHOL, TRIG, HDL, CHOLHDL, VLDL, LDLCALC, LDLDIRECT  Physical Exam:    VS:  BP 140/80   Pulse 72   Ht 5\' 8"  (1.727 m)   Wt 199 lb 3.2 oz (90.4 kg)   SpO2 99%   BMI 30.29 kg/m     Wt Readings from Last 3 Encounters:  08/07/20 199 lb 3.2 oz (90.4 kg)  05/07/20 188 lb 9.6 oz (85.5 kg)  04/26/20 (P) 176 lb (79.8 kg)     GEN: Well nourished, well developed in no acute distress HEENT: Normal NECK: No JVD; No carotid bruits LYMPHATICS: No lymphadenopathy CARDIAC: RRR, no murmurs, rubs, gallops RESPIRATORY:  Clear to auscultation without rales, wheezing or rhonchi  ABDOMEN:  Soft, non-tender, non-distended MUSCULOSKELETAL:  No edema; No deformity  SKIN: Warm and dry NEUROLOGIC:  Alert and oriented x 3 PSYCHIATRIC:  Normal affect   ASSESSMENT:    1. Chest pain of uncertain etiology   2. Essential hypertension    PLAN:    Chest pain: describes exertional chest pain that resolves with rest.  He does have significant CAD risk factors (hypertension, tobacco use, family history).  Cardiac catheterization shows normal coronary arteries.  Differential includes microvascular disease versus noncardiac chest pain, likely musculoskeletal.  Suspect likely noncardiac chest pain. -Stop metoprolol -No further cardiac work-up recommended  Hypertension: On telmisartan 80 mg daily and Toprol-XL 25 mg daily.  Stopping metoprolol as above.  Asked patient to monitor BP daily for next week since coming off metoprolol  RTC in 1 year   Medication Adjustments/Labs and Tests Ordered: Current medicines are reviewed at length with the patient today.  Concerns regarding medicines are outlined above.  No orders of the defined types were placed in this encounter.  No orders of the defined types were placed in this encounter.   Patient Instructions  Medication Instructions:  STOP metoprolol  *If you need a refill on your cardiac medications before your next appointment, please call your pharmacy*  Follow-Up: At [facility], you and your health needs are our priority.  As part of our continuing mission to provide you with exceptional heart care, we have created designated Provider Care Teams.  These Care Teams include your primary Cardiologist (physician) and Advanced Practice Providers (APPs -  Physician Assistants and Nurse Practitioners) who all work together to provide you with the care you need, when you need it.  We recommend signing up for the patient portal called "MyChart".  Sign up information is provided on this After Visit Summary.  MyChart  is used to connect with  patients for Virtual Visits (Telemedicine).  Patients are able to view lab/test results, encounter notes, upcoming appointments, etc.  Non-urgent messages can be sent to your provider as well.   To learn more about what you can do with MyChart, go to [email or web address].    Your next appointment:   12 month(s)  The format for your next appointment:   In Person  Provider:   Epifanio Lescheshristopher Missy Baksh, MD   Other Instructions Please check your blood pressure at home daily, write it down.  Call the office or send message via Mychart with the readings in 1 week for Dr. Bjorn PippinSchumann to review.       Signed, Little Ishikawahristopher L Janthony Holleman, MD  08/07/2020 5:14 PM     [facility]

## 2020-08-07 ENCOUNTER — Other Ambulatory Visit: Payer: Self-pay

## 2020-08-07 ENCOUNTER — Ambulatory Visit (INDEPENDENT_AMBULATORY_CARE_PROVIDER_SITE_OTHER): Payer: 59 | Admitting: Cardiology

## 2020-08-07 VITALS — BP 140/80 | HR 72 | Ht 68.0 in | Wt 199.2 lb

## 2020-08-07 DIAGNOSIS — R079 Chest pain, unspecified: Secondary | ICD-10-CM | POA: Diagnosis not present

## 2020-08-07 DIAGNOSIS — I10 Essential (primary) hypertension: Secondary | ICD-10-CM

## 2020-08-07 NOTE — Patient Instructions (Signed)
Medication Instructions:  STOP metoprolol  *If you need a refill on your cardiac medications before your next appointment, please call your pharmacy*  Follow-Up: At [facility], you and your health needs are our priority.  As part of our continuing mission to provide you with exceptional heart care, we have created designated Provider Care Teams.  These Care Teams include your primary Cardiologist (physician) and Advanced Practice Providers (APPs -  Physician Assistants and Nurse Practitioners) who all work together to provide you with the care you need, when you need it.  We recommend signing up for the patient portal called "MyChart".  Sign up information is provided on this After Visit Summary.  MyChart is used to connect with patients for Virtual Visits (Telemedicine).  Patients are able to view lab/test results, encounter notes, upcoming appointments, etc.  Non-urgent messages can be sent to your provider as well.   To learn more about what you can do with MyChart, go to [email or web address].    Your next appointment:   12 month(s)  The format for your next appointment:   In Person  Provider:   Epifanio Lesches, MD   Other Instructions Please check your blood pressure at home daily, write it down.  Call the office or send message via Mychart with the readings in 1 week for Dr. Bjorn Pippin to review.

## 2020-09-06 MED ORDER — AMLODIPINE BESYLATE 5 MG PO TABS
5.0000 mg | ORAL_TABLET | Freq: Every day | ORAL | 3 refills | Status: DC
Start: 2020-09-06 — End: 2021-09-02

## 2020-12-24 ENCOUNTER — Other Ambulatory Visit (HOSPITAL_COMMUNITY): Payer: Self-pay | Admitting: Family Medicine

## 2020-12-24 ENCOUNTER — Other Ambulatory Visit: Payer: Self-pay | Admitting: Family Medicine

## 2020-12-24 DIAGNOSIS — R1084 Generalized abdominal pain: Secondary | ICD-10-CM

## 2020-12-25 ENCOUNTER — Other Ambulatory Visit: Payer: 59

## 2020-12-26 ENCOUNTER — Ambulatory Visit
Admission: RE | Admit: 2020-12-26 | Discharge: 2020-12-26 | Disposition: A | Discharge status: Home / Self Care | Payer: 59 | Source: Ambulatory Visit | Attending: Family Medicine | Admitting: Family Medicine

## 2020-12-26 DIAGNOSIS — R1084 Generalized abdominal pain: Secondary | ICD-10-CM

## 2020-12-27 ENCOUNTER — Other Ambulatory Visit: Payer: Self-pay | Admitting: Family Medicine

## 2020-12-27 DIAGNOSIS — R935 Abnormal findings on diagnostic imaging of other abdominal regions, including retroperitoneum: Secondary | ICD-10-CM

## 2021-01-14 ENCOUNTER — Other Ambulatory Visit: Payer: Self-pay

## 2021-01-14 ENCOUNTER — Ambulatory Visit
Admission: RE | Admit: 2021-01-14 | Discharge: 2021-01-14 | Disposition: A | Discharge status: Home / Self Care | Payer: 59 | Source: Ambulatory Visit | Attending: Family Medicine | Admitting: Family Medicine

## 2021-01-14 DIAGNOSIS — R935 Abnormal findings on diagnostic imaging of other abdominal regions, including retroperitoneum: Secondary | ICD-10-CM

## 2021-01-14 MED ORDER — GADOBENATE DIMEGLUMINE 529 MG/ML IV SOLN
17.0000 mL | Freq: Once | INTRAVENOUS | Status: AC | PRN
  Administered 2021-01-14: 17 mL via INTRAVENOUS

## 2021-08-31 ENCOUNTER — Other Ambulatory Visit: Payer: Self-pay | Admitting: Cardiology

## 2022-04-11 ENCOUNTER — Telehealth: Payer: Self-pay | Admitting: Cardiology

## 2022-04-11 NOTE — Telephone Encounter (Addendum)
Spoke with pt who complains on intermittent CP x 2 weeks.  Pt states it happens throughout the day even without exertion.  He denies additional symptoms with the pain.  Pain may last 15-20 minutes.  He denies current active CP, SOB or dizziness/fainting.  He has not tried taking nitro as it has expired.  He takes medications as prescribed.  He does report feeling his heart racing at times which is new.  He does not have a current HR or BP available.  He no longer takes ASA.  Last seen by Dr Bjorn Pippin in 2021. Appointment scheduled with Dr Eldridge Dace, DOD 04/17/2022.  Reviewed ED precautions.  Pt verbalizes understanding and agrees with current plan.

## 2022-04-11 NOTE — Telephone Encounter (Signed)
Pt c/o of Chest Pain: STAT if CP now or developed within 24 hours  1. Are you having CP right now? No   2. Are you experiencing any other symptoms (ex. SOB, nausea, vomiting, sweating)? No   3. How long have you been experiencing CP? 1 to 2 weeks  4. Is your CP continuous or coming and going? Continuous  5. Have you taken Nitroglycerin? No  ?

## 2022-04-17 ENCOUNTER — Ambulatory Visit (INDEPENDENT_AMBULATORY_CARE_PROVIDER_SITE_OTHER): Payer: 59

## 2022-04-17 ENCOUNTER — Ambulatory Visit: Payer: 59 | Admitting: Interventional Cardiology

## 2022-04-17 ENCOUNTER — Encounter: Payer: Self-pay | Admitting: Interventional Cardiology

## 2022-04-17 VITALS — BP 134/78 | HR 79 | Ht 68.0 in | Wt 208.0 lb

## 2022-04-17 DIAGNOSIS — R002 Palpitations: Secondary | ICD-10-CM | POA: Diagnosis not present

## 2022-04-17 DIAGNOSIS — Z8249 Family history of ischemic heart disease and other diseases of the circulatory system: Secondary | ICD-10-CM | POA: Diagnosis not present

## 2022-04-17 DIAGNOSIS — I10 Essential (primary) hypertension: Secondary | ICD-10-CM | POA: Diagnosis not present

## 2022-04-17 DIAGNOSIS — Z720 Tobacco use: Secondary | ICD-10-CM

## 2022-04-17 DIAGNOSIS — R072 Precordial pain: Secondary | ICD-10-CM

## 2022-04-17 NOTE — Progress Notes (Signed)
Cardiology Office Note   Date:  04/17/2022   ID:  Andrew Delgado, DOB 02/01/1976, MRN [id number]  PCP:  Daisy Florooss, Charles Alan, MD  Primary cardiologist: Dr. Nathaneil Canaryhris Shumann  No chief complaint on file.  Chest pain  Wt Readings from Last 3 Encounters:  04/17/22 208 lb (94.3 kg)  08/07/20 199 lb 3.2 oz (90.4 kg)  05/07/20 188 lb 9.6 oz (85.5 kg)       History of Present Illness: Andrew Delgado is a 46 y.o. male who initially seen on 04/23/2020 he reports that he has been having intermittent chest pain since 2017 but has progressed over the prior 2 weeks.  Cardiac catheterization on 04/26/2020 showed normal coronary arteries, normal LV systolic function, normal LVEDP.    After the catheterization, he did well.  His chest pain did not totally go away but it decreased in frequency.  He went back to running.  He was last seen in September 2021.  In the past three weeks, he has had more frequent chest pains.  Feels sx on the left side of his chest- lasting a few minutes at a time typically.  He can rub the area with some relief.  He has had palpitations- episodes lasting a few minutes- he deep breaths and this helps resolve sx- no notable triggers.  Has had some associated shortness of breath as well.   Denies :  Dizziness. Leg edema. Nitroglycerin use. Orthopnea.  Paroxysmal nocturnal dyspnea. Syncope.    Held off on walking in the last few weeks.  Prior to the last few weeks, walked 12 miles a week.  No sx prior to the past few weeks.    Past Medical History:  Diagnosis Date   Abnormal PFT    Allergic rhinitis    Asthma    as a child   Chest pain    Family history of adverse reaction to anesthesia    it takes my mother a long time to come out of anesthesia and PONV   Headache    Hypertension    Hypoglycemic reaction    history"hypoglycemic"   Insomnia    Pneumonia    Pneumonia- severe case 4'16 (multiple rounds oral antibiotics).Using Inhaler daily.Occ. residual  cough.   PONV (postoperative nausea and vomiting)    Umbilical hernia    Wears glasses     Past Surgical History:  Procedure Laterality Date   ADENOIDECTOMY     CIRCUMCISION     INSERTION OF MESH N/A 07/13/2015   Procedure: INSERTION OF MESH;  Surgeon: Axel FillerArmando Ramirez, MD;  Location: [facility] OR;  Service: General;  Laterality: N/A;   LEFT HEART CATH AND CORONARY ANGIOGRAPHY N/A 04/26/2020   Procedure: LEFT HEART CATH AND CORONARY ANGIOGRAPHY;  Surgeon: Runell GessBerry, Jonathan J, MD;  Location: [facility] INVASIVE [facility];  Service: Cardiovascular;  Laterality: N/A;   MYRINGOTOMY WITH TUBE PLACEMENT     x2 - 30 yrs ago   UMBILICAL HERNIA REPAIR N/A 07/13/2015   Procedure: LAPAROSCOPIC UMBILICAL HERNIA REPAIR WITH MESH  X 2 ;  Surgeon: Axel FillerArmando Ramirez, MD;  Location: [facility] OR;  Service: General;  Laterality: N/A;   WISDOM TOOTH EXTRACTION       Current Outpatient Medications  Medication Sig Dispense Refill   amLODipine (NORVASC) 5 MG tablet TAKE 1 TABLET BY MOUTH EVERY DAY 30 tablet 11   azelastine (ASTELIN) 0.1 % nasal spray Place 1 spray into both nostrils 2 (two) times daily. Use in each nostril as directed  budesonide-formoterol (SYMBICORT) 160-4.5 MCG/ACT inhaler Inhale 2 puffs into the lungs 2 (two) times daily.     Calcium Carb-Cholecalciferol (CALCIUM 600 + D PO) Take 1 tablet by mouth daily.     Cholecalciferol (VITAMIN D) 50 MCG (2000 UT) tablet Take 2,000 Units by mouth daily.     fexofenadine (ALLEGRA) 180 MG tablet Take 180 mg by mouth daily.     fluticasone (FLONASE) 50 MCG/ACT nasal spray Place 1 spray into both nostrils in the morning and at bedtime.      Multiple Vitamin (MULTIVITAMIN WITH MINERALS) TABS tablet Take 1 tablet by mouth daily.     telmisartan (MICARDIS) 80 MG tablet Take 80 mg by mouth daily.     traZODone (DESYREL) 150 MG tablet Take 150 mg by mouth at bedtime.     zolpidem (AMBIEN) 10 MG tablet Take 10 mg by mouth at bedtime as needed for sleep.      Dextromethorphan-guaiFENesin (MUCINEX DM MAXIMUM STRENGTH) 60-1200 MG TB12 Take 1 tablet by mouth 2 (two) times daily as needed (congestion). (Patient not taking: Reported on 04/17/2022)     nitroGLYCERIN (NITROSTAT) 0.4 MG SL tablet Place 1 tablet (0.4 mg total) under the tongue every 5 (five) minutes as needed for chest pain. 25 tablet 3   Current Facility-Administered Medications  Medication Dose Route Frequency Provider Last Rate Last Admin   sodium chloride flush (NS) 0.9 % injection 3 mL  3 mL Intravenous Q12H Little Ishikawa, MD        Allergies:   Augmentin [amoxicillin-pot clavulanate], Chantix [varenicline], and Prednisone    Social History:  The patient  reports that he quit smoking about 11 years ago. His smoking use included cigarettes. He has a 20.00 pack-year smoking history. He has quit using smokeless tobacco.  His smokeless tobacco use included chew. He reports current alcohol use. He reports that he does not use drugs.   Family History:  The patient's family history includes Allergies in his sister; Anuerysm in his father; Heart disease in his mother.    ROS:  Please see the history of present illness.   Otherwise, review of systems are positive for palpitations.   All other systems are reviewed and negative.    PHYSICAL EXAM: VS:  BP 134/78   Pulse 79   Ht 5\' 8"  (1.727 m)   Wt 208 lb (94.3 kg)   SpO2 98%   BMI 31.63 kg/m  , BMI Body mass index is 31.63 kg/m. GEN: Well nourished, well developed, in no acute distress HEENT: normal Neck: no JVD, carotid bruits, or masses Cardiac: RRR; no murmurs, rubs, or gallops,no edema  Respiratory:  clear to auscultation bilaterally, normal work of breathing GI: soft, nontender, nondistended, + BS MS: no deformity or atrophy Skin: warm and dry, no rash Neuro:  Strength and sensation are intact Psych: euthymic mood, full affect   EKG:   The ekg ordered today demonstrates normal ECG   Recent Labs: No results found  for requested labs within last 8760 hours.   Lipid Panel No results found for: CHOL, TRIG, HDL, CHOLHDL, VLDL, LDLCALC, LDLDIRECT   Other studies Reviewed: Additional studies/ records that were reviewed today with results demonstrating: TC 2227, LDL 138, HDL 70, TG 107.   ASSESSMENT AND PLAN:  Precordial chest pain: Has had chest pain in the past which was thought to be noncardiac, likely musculoskeletal.  Last cath 2021.  Chest pain that is described as late is also atypical.  No further ischemic testing  indicated at this time.  Continue aggressive secondary prevention.  We discussed regular exercise including the "talk test." Palpitations: 2 week Zio patch.  Hypertension: The current medical regimen is effective;  continue present plan and medications.  Avoid processed foods.  Avoid excessive sodium. Tobacco abuse: Quit smoking in 2012.  Family history of CAD: Mother had MI in her 72s.  Whole food, plant-based diet.  High-fiber diet.  May need a fasting lipid profile after he has increased his exercise regimen.   Current medicines are reviewed at length with the patient today.  The patient concerns regarding his medicines were addressed.  The following changes have been made:  No change  Labs/ tests ordered today include: 2 week Zio patch No orders of the defined types were placed in this encounter.   Recommend 150 minutes/week of aerobic exercise Low fat, low carb, high fiber diet recommended  Disposition:   FU based on Zio patch result   Signed, Lance Muss, MD  04/17/2022 11:47 AM    [facility] [address] [place], [place], [place]  [address] Phone: [phone]; Fax: [phone]

## 2022-04-17 NOTE — Patient Instructions (Signed)
Medication Instructions:  Your physician recommends that you continue on your current medications as directed. Please refer to the Current Medication list given to you today.  *If you need a refill on your cardiac medications before your next appointment, please call your pharmacy*   Lab Work: none If you have labs (blood work) drawn today and your tests are completely normal, you will receive your results only by: MyChart Message (if you have MyChart) OR A paper copy in the mail If you have any lab test that is abnormal or we need to change your treatment, we will call you to review the results.   Testing/Procedures: Dr Eldridge Dace recommends you wear a 2 week zio monitor   Follow-Up: At [facility], you and your health needs are our priority.  As part of our continuing mission to provide you with exceptional heart care, we have created designated Provider Care Teams.  These Care Teams include your primary Cardiologist (physician) and Advanced Practice Providers (APPs -  Physician Assistants and Nurse Practitioners) who all work together to provide you with the care you need, when you need it.  We recommend signing up for the patient portal called "MyChart".  Sign up information is provided on this After Visit Summary.  MyChart is used to connect with patients for Virtual Visits (Telemedicine).  Patients are able to view lab/test results, encounter notes, upcoming appointments, etc.  Non-urgent messages can be sent to your provider as well.   To learn more about what you can do with MyChart, go to [email or web address].    Your next appointment:   Based on results  The format for your next appointment:   In Person  Provider:   Dr Bjorn Pippin  If primary card or EP is not listed click here to update    :1}    Other Instructions ZIO XT- Long Term Monitor Instructions  Your physician has requested you wear a ZIO patch monitor for 14 days.  This is a single patch monitor. [employer]  supplies one patch monitor per enrollment. Additional stickers are not available. Please do not apply patch if you will be having a Nuclear Stress Test,  Echocardiogram, Cardiac CT, MRI, or Chest Xray during the period you would be wearing the  monitor. The patch cannot be worn during these tests. You cannot remove and re-apply the  ZIO XT patch monitor.  Your ZIO patch monitor will be mailed 3 day USPS to your address on file. It may take 3-5 days  to receive your monitor after you have been enrolled.  Once you have received your monitor, please review the enclosed instructions. Your monitor  has already been registered assigning a specific monitor serial # to you.  Billing and Patient Assistance Program Information  We have supplied [employer] with any of your insurance information on file for billing purposes. [employer] offers a sliding scale Patient Assistance Program for patients that do not have  insurance, or whose insurance does not completely cover the cost of the ZIO monitor.  You must apply for the Patient Assistance Program to qualify for this discounted rate.  To apply, please call [employer] at [phone], select option 4, select option 2, ask to apply for  Patient Assistance Program. Meredeth Ide will ask your household income, and how many people  are in your household. They will quote your out-of-pocket cost based on that information.  [employer] will also be able to set up a 31-month, interest-free payment plan if needed.  Applying the monitor  Shave hair from upper left chest.  Hold abrader disc by orange tab. Rub abrader in 40 strokes over the upper left chest as  indicated in your monitor instructions.  Clean area with 4 enclosed alcohol pads. Let dry.  Apply patch as indicated in monitor instructions. Patch will be placed under collarbone on left  side of chest with arrow pointing upward.  Rub patch adhesive wings for 2 minutes. Remove white label marked "1". Remove the white   label marked "2". Rub patch adhesive wings for 2 additional minutes.  While looking in a mirror, press and release button in center of patch. A small green light will  flash 3-4 times. This will be your only indicator that the monitor has been turned on.  Do not shower for the first 24 hours. You may shower after the first 24 hours.  Press the button if you feel a symptom. You will hear a small click. Record Date, Time and  Symptom in the Patient Logbook.  When you are ready to remove the patch, follow instructions on the last 2 pages of Patient  Logbook. Stick patch monitor onto the last page of Patient Logbook.  Place Patient Logbook in the blue and white box. Use locking tab on box and tape box closed  securely. The blue and white box has prepaid postage on it. Please place it in the mailbox as  soon as possible. Your physician should have your test results approximately 7 days after the  monitor has been mailed back to [facility].  Call [facility] Customer Care at [phone] if you have questions regarding  your ZIO XT patch monitor. Call them immediately if you see an orange light blinking on your  monitor.  If your monitor falls off in less than 4 days, contact our Monitor department at [phone].  If your monitor becomes loose or falls off after 4 days call [employer] at [phone] for  suggestions on securing your monitor   Important Information About Sugar

## 2022-04-17 NOTE — Progress Notes (Unsigned)
Enrolled for [employer] to mail a ZIO XT long term holter monitor to the patients address on file.  

## 2022-04-29 DIAGNOSIS — R002 Palpitations: Secondary | ICD-10-CM

## 2022-06-05 ENCOUNTER — Encounter: Payer: Self-pay | Admitting: Interventional Cardiology

## 2022-06-05 DIAGNOSIS — R002 Palpitations: Secondary | ICD-10-CM

## 2022-06-05 NOTE — Telephone Encounter (Signed)
Brief episode of NSVT on monitor, otherwise unremarkable. Recommend echocardiogram prior to his surgery.

## 2022-06-17 ENCOUNTER — Ambulatory Visit (INDEPENDENT_AMBULATORY_CARE_PROVIDER_SITE_OTHER): Payer: 59

## 2022-06-17 DIAGNOSIS — R002 Palpitations: Secondary | ICD-10-CM | POA: Diagnosis not present

## 2022-06-17 DIAGNOSIS — Z01810 Encounter for preprocedural cardiovascular examination: Secondary | ICD-10-CM

## 2022-06-17 LAB — ECHOCARDIOGRAM COMPLETE
AR max vel: 2.53 cm2
AV Area VTI: 2.46 cm2
AV Area mean vel: 2.42 cm2
AV Mean grad: 4 mmHg
AV Peak grad: 7.1 mmHg
Ao pk vel: 1.33 m/s
Area-P 1/2: 5.13 cm2
S' Lateral: 3.1 cm

## 2022-06-19 ENCOUNTER — Other Ambulatory Visit (HOSPITAL_COMMUNITY): Payer: 59

## 2022-09-07 ENCOUNTER — Other Ambulatory Visit: Payer: Self-pay | Admitting: Cardiology

## 2023-08-06 ENCOUNTER — Other Ambulatory Visit (HOSPITAL_BASED_OUTPATIENT_CLINIC_OR_DEPARTMENT_OTHER): Payer: Self-pay | Admitting: Family Medicine

## 2023-08-06 DIAGNOSIS — Z0000 Encounter for general adult medical examination without abnormal findings: Secondary | ICD-10-CM

## 2023-08-27 ENCOUNTER — Other Ambulatory Visit: Payer: Self-pay | Admitting: General Surgery

## 2023-08-27 DIAGNOSIS — Z8719 Personal history of other diseases of the digestive system: Secondary | ICD-10-CM

## 2023-08-31 ENCOUNTER — Ambulatory Visit
Admission: RE | Admit: 2023-08-31 | Discharge: 2023-08-31 | Disposition: A | Discharge status: Home / Self Care | Payer: 59 | Source: Ambulatory Visit | Attending: General Surgery | Admitting: General Surgery

## 2023-08-31 DIAGNOSIS — Z8719 Personal history of other diseases of the digestive system: Secondary | ICD-10-CM

## 2023-09-02 ENCOUNTER — Other Ambulatory Visit: Payer: Self-pay | Admitting: Cardiology

## 2023-09-07 ENCOUNTER — Other Ambulatory Visit: Payer: Self-pay | Admitting: Cardiology

## 2023-09-09 ENCOUNTER — Other Ambulatory Visit: Payer: Self-pay | Admitting: General Surgery

## 2023-09-09 DIAGNOSIS — R1900 Intra-abdominal and pelvic swelling, mass and lump, unspecified site: Secondary | ICD-10-CM

## 2023-09-11 ENCOUNTER — Other Ambulatory Visit (HOSPITAL_BASED_OUTPATIENT_CLINIC_OR_DEPARTMENT_OTHER): Payer: 59

## 2023-09-17 ENCOUNTER — Ambulatory Visit
Admission: RE | Admit: 2023-09-17 | Discharge: 2023-09-17 | Disposition: A | Discharge status: Home / Self Care | Payer: 59 | Source: Ambulatory Visit | Attending: General Surgery | Admitting: General Surgery

## 2023-09-17 ENCOUNTER — Other Ambulatory Visit: Payer: 59

## 2023-09-17 DIAGNOSIS — R1900 Intra-abdominal and pelvic swelling, mass and lump, unspecified site: Secondary | ICD-10-CM

## 2023-09-30 ENCOUNTER — Other Ambulatory Visit: Payer: Self-pay | Admitting: Cardiology

## 2023-09-30 ENCOUNTER — Ambulatory Visit (HOSPITAL_BASED_OUTPATIENT_CLINIC_OR_DEPARTMENT_OTHER): Payer: 59

## 2023-11-13 ENCOUNTER — Encounter (HOSPITAL_BASED_OUTPATIENT_CLINIC_OR_DEPARTMENT_OTHER): Payer: Self-pay

## 2023-11-13 ENCOUNTER — Other Ambulatory Visit (HOSPITAL_BASED_OUTPATIENT_CLINIC_OR_DEPARTMENT_OTHER): Payer: 59

## 2024-01-03 ENCOUNTER — Other Ambulatory Visit: Payer: Self-pay | Admitting: Cardiology

## 2024-01-04 ENCOUNTER — Telehealth: Payer: Self-pay | Admitting: Cardiology

## 2024-01-04 NOTE — Telephone Encounter (Signed)
 Pt wants to come in for EKG for chest pressure

## 2024-01-04 NOTE — Telephone Encounter (Signed)
 Spoke to patient he stated he has been having chest pain off and on for the past 1 week.Stated last night his chest felt tight,at present slight chest tightness.Appointment scheduled with Azalee Course PA 2/18 at 3:35 pm.Advised if chest tightness worsens he will need to go to ED to be evaluated.

## 2024-01-05 ENCOUNTER — Encounter: Payer: Self-pay | Admitting: Physician Assistant

## 2024-01-05 ENCOUNTER — Ambulatory Visit: Payer: 59 | Attending: Physician Assistant | Admitting: Physician Assistant

## 2024-01-05 VITALS — BP 155/101 | HR 102 | Ht 68.0 in | Wt 224.6 lb

## 2024-01-05 DIAGNOSIS — R0789 Other chest pain: Secondary | ICD-10-CM | POA: Diagnosis not present

## 2024-01-05 DIAGNOSIS — I10 Essential (primary) hypertension: Secondary | ICD-10-CM | POA: Diagnosis not present

## 2024-01-05 MED ORDER — NITROGLYCERIN 0.4 MG SL SUBL: 0.4000 mg | SUBLINGUAL_TABLET | SUBLINGUAL | 3 refills | Status: AC | PRN

## 2024-01-05 MED ORDER — AMLODIPINE BESYLATE 5 MG PO TABS: 5.0000 mg | ORAL_TABLET | Freq: Every day | ORAL | 0 refills | Status: DC

## 2024-01-05 NOTE — Progress Notes (Unsigned)
 Cardiology Office Note:  .   Date:  01/06/2024  ID:  Andrew Delgado, DOB Mar 27, 1976, MRN [id number] PCP: Daisy Floro, MD  [facility] Providers Cardiologist:  Little Ishikawa, MD     History of Present Illness: .   Andrew Delgado is a 48 y.o. male with PMH of chest pain with normal coronary arteries on cath 04/26/2020, hypertension, tobacco abuse, family history of CAD and insomnia.  PFT performed in June 2016 demonstrated moderately severe obstructive airway disease, air trapping, insignificant response to bronchodilator, moderately severe restriction.  Patient was last seen by Dr. Eldridge Dace in June 2023, a 2-week ZIO monitor was ordered for palpitation.  Heart monitor performed in July 2023 showed normal sinus rhythm with rare PAC and PVCs, single 5 beat run of nonsustained VT associated with palpitation.  Echocardiogram performed on 06/17/2022 showed EF 55 to 60%, no regional wall motion abnormality, no significant valve issue.   For the past week, patient has been having intermittent chest discomfort.  The chest discomfort involves a focal point over the left upper pectoral area, left arm, right chest and also left neck.  Symptom does not stay at one spot.  Symptom actually improves with physical exertion when he runs around with his 47 year old.  He has no lower extremity edema, orthopnea or PND.  He has no worsening shortness of breath.  His symptom sounds very atypical.  I recommended reassessment in 6 weeks, if symptoms still persist, I will consider coronary CT.  He has been instructed to contact cardiology service if his symptoms changes in his next 6 weeks.  ROS:   Patient complains of intermittent chest pain, this could be in the left upper pectoral area, right chest, left neck and left forearm.  Studies Reviewed: Marland Kitchen   EKG Interpretation Date/Time:  Tuesday January 05 2024 15:53:13 EST Ventricular Rate:  102 PR Interval:  154 QRS Duration:  86 QT  Interval:  336 QTC Calculation: 437 R Axis:   5  Text Interpretation: Sinus tachycardia When compared with ECG of 30-Nov-2018 12:56, Questionable change in QRS axis Confirmed by Azalee Course [phone]) on 01/05/2024 3:57:56 PM    Cardiac Studies & Procedures   ______________________________________________________________________________________________ CARDIAC CATHETERIZATION  CARDIAC CATHETERIZATION 04/26/2020  Narrative Images from the original result were not included.   The left ventricular systolic function is normal.  LV end diastolic pressure is normal.  The left ventricular ejection fraction is 55-65% by visual estimate.  [name] is a 48 y.o. male   [id number] LOCATION:  FACILITY: [facility] PHYSICIAN: Nanetta Batty, M.D. 22-Sep-1976   DATE OF PROCEDURE:  04/26/2020  DATE OF DISCHARGE:     CARDIAC CATHETERIZATION    History obtained from chart review.  Mr. 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  He does have a family history of heart disease with a mother who had an MI at an early age.  He has hypertension.  He has had several episodes of effort angina while hiking with his wife and saw Dr. Bjorn Pippin in the office who recommended diagnostic coronary angiography.  Impression Mr. Googe has normal coronary arteries and normal LV function.  I believe his chest pain is noncardiac.  The sheath was removed and a TR band was placed on the right wrist to achieve patent hemostasis.  The patient left lab in stable condition.  He will be  discharged home later today as an outpatient and follow-up with Dr. Bjorn Pippin who has been notified of the results of the study.  Nanetta Batty. MD, [facility] 04/26/2020 11:30 AM  Findings Coronary Findings Diagnostic  Dominance: Right  No diagnostic findings have been documented. Intervention  No interventions have been  documented.     ECHOCARDIOGRAM  ECHOCARDIOGRAM COMPLETE 06/17/2022  Narrative ECHOCARDIOGRAM REPORT    Patient Name:   Andrew Delgado Date of Exam: 06/17/2022 Medical Rec #:  [id number]            Height:       68.0 in Accession #:    [id number]           Weight:       208.0 lb Date of Birth:  [birth year]            BSA:          2.078 m Patient Age:    46 years             BP:           110/80 mmHg Patient Gender: M                    HR:           83 bpm. Exam Location:  Outpatient  Procedure: 2D Echo, 3D Echo, Color Doppler, Cardiac Doppler and Strain Analysis  Indications:    Preoperative clearance  History:        Patient has no prior history of Echocardiogram examinations. Signs/Symptoms:Chest Pain; Risk Factors:Former Smoker and Hypertension. Palpitations.  Sonographer:    Jeryl Columbia RDCS Referring Phys: [id number] CHRISTOPHER L SCHUMANN  IMPRESSIONS   1. Left ventricular ejection fraction, by estimation, is 55 to 60%. Left ventricular ejection fraction by 3D volume is 58 %. The left ventricle has normal function. The left ventricle has no regional wall motion abnormalities. Left ventricular diastolic parameters were normal. 2. Right ventricular systolic function is normal. The right ventricular size is normal. Tricuspid regurgitation signal is inadequate for assessing PA pressure. 3. The mitral valve is normal in structure. No evidence of mitral valve regurgitation. No evidence of mitral stenosis. 4. The aortic valve is normal in structure. Aortic valve regurgitation is not visualized. No aortic stenosis is present. 5. The inferior vena cava is normal in size with greater than 50% respiratory variability, suggesting right atrial pressure of 3 mmHg.  Comparison(s): Cardiac catheterization on 04/26/2020 showed normal coronary arteries, normal LV systolic function, normal LVEDP.  Conclusion(s)/Recommendation(s): Normal biventricular function without evidence of  hemodynamically significant valvular heart disease.  FINDINGS Left Ventricle: Left ventricular ejection fraction, by estimation, is 55 to 60%. Left ventricular ejection fraction by 3D volume is 58 %. The left ventricle has normal function. The left ventricle has no regional wall motion abnormalities. Global longitudinal strain performed but not reported based on interpreter judgement due to suboptimal tracking. The left ventricular internal cavity size was normal in size. There is no left ventricular hypertrophy. Left ventricular diastolic parameters were normal.  Right Ventricle: The right ventricular size is normal. No increase in right ventricular wall thickness. Right ventricular systolic function is normal. Tricuspid regurgitation signal is inadequate for assessing PA pressure.  Left Atrium: Left atrial size was normal in size.  Right Atrium: Right atrial size was normal in size.  Pericardium: There is no evidence of pericardial effusion.  Mitral Valve: The mitral valve is normal in structure. No evidence of mitral valve regurgitation. No  evidence of mitral valve stenosis.  Tricuspid Valve: The tricuspid valve is normal in structure. Tricuspid valve regurgitation is not demonstrated. No evidence of tricuspid stenosis.  Aortic Valve: The aortic valve is normal in structure. Aortic valve regurgitation is not visualized. No aortic stenosis is present. Aortic valve mean gradient measures 4.0 mmHg. Aortic valve peak gradient measures 7.1 mmHg. Aortic valve area, by VTI measures 2.46 cm.  Pulmonic Valve: The pulmonic valve was normal in structure. Pulmonic valve regurgitation is not visualized. No evidence of pulmonic stenosis.  Aorta: The aortic root is normal in size and structure.  Venous: The inferior vena cava is normal in size with greater than 50% respiratory variability, suggesting right atrial pressure of 3 mmHg.  IAS/Shunts: No atrial level shunt detected by color flow  Doppler.   LEFT VENTRICLE PLAX 2D LVIDd:         4.59 cm   Diastology LVIDs:         3.10 cm   LV e' medial:    10.40 cm/s LV PW:         1.22 cm   LV E/e' medial:  7.8 LV IVS:        0.89 cm   LV e' lateral:   12.80 cm/s LVOT diam:     2.00 cm   LV E/e' lateral: 6.3 LV SV:         64 LV SV Index:   31 LVOT Area:     3.14 cm  3D Volume EF: 3D EF:        58 % LV EDV:       102 ml LV ESV:       43 ml LV SV:        59 ml  RIGHT VENTRICLE RV Basal diam:  4.16 cm RV Mid diam:    4.10 cm RV S prime:     13.10 cm/s TAPSE (M-mode): 1.2 cm  LEFT ATRIUM             Index        RIGHT ATRIUM           Index LA diam:        3.20 cm 1.54 cm/m   RA Area:     12.70 cm LA Vol (A2C):   44.3 ml 21.32 ml/m  RA Volume:   27.80 ml  13.38 ml/m LA Vol (A4C):   33.7 ml 16.22 ml/m LA Biplane Vol: 40.9 ml 19.68 ml/m AORTIC VALVE AV Area (Vmax):    2.53 cm AV Area (Vmean):   2.42 cm AV Area (VTI):     2.46 cm AV Vmax:           133.00 cm/s AV Vmean:          92.400 cm/s AV VTI:            0.262 m AV Peak Grad:      7.1 mmHg AV Mean Grad:      4.0 mmHg LVOT Vmax:         107.00 cm/s LVOT Vmean:        71.300 cm/s LVOT VTI:          0.205 m LVOT/AV VTI ratio: 0.78  AORTA Ao Root diam: 2.80 cm Ao Asc diam:  3.10 cm  MITRAL VALVE MV Area (PHT): 5.13 cm    SHUNTS MV Decel Time: 148 msec    Systemic VTI:  0.20 m MV E velocity: 81.20 cm/s  Systemic Diam: 2.00  cm MV A velocity: 69.20 cm/s MV E/A ratio:  1.17  Mihai Croitoru MD Electronically signed by Thurmon Fair MD Signature Date/Time: 06/17/2022/8:57:01 AM    Final    MONITORS  LONG TERM MONITOR (3-14 DAYS) 06/03/2022  Narrative   Normal sinus rhythm with rare PACs and PVCs.   Single 5 beat episode of nonsustained VTach, associated with palpitations.   Brief run of SVT which did not cause symptoms.   Patch Wear Time:  12 days and 4 hours (2023-06-13T09:37:58-0400 to 2023-06-25T14:26:50-0400)  Patient had a min  HR of 54 bpm, max HR of 266 bpm, and avg HR of 86 bpm. Predominant underlying rhythm was Sinus Rhythm. 1 run of Ventricular Tachycardia occurred lasting 5 beats with a max rate of 266 bpm (avg 245 bpm). 1 run of Supraventricular Tachycardia occurred lasting 8 beats with a max rate of 150 bpm (avg 142 bpm). Ventricular Tachycardia was detected within +/- 45 seconds of symptomatic patient event(s). Isolated SVEs were rare (<1.0%), SVE Couplets were rare (<1.0%), and no SVE Triplets were present. Isolated VEs were rare (<1.0%), VE Couplets were rare (<1.0%), and no VE Triplets were present.       ______________________________________________________________________________________________      Risk Assessment/Calculations:            Physical Exam:   VS:  BP (!) 155/101   Pulse (!) 102   Ht 5\' 8"  (1.727 m)   Wt 224 lb 9.6 oz (101.9 kg)   SpO2 99%   BMI 34.15 kg/m    Wt Readings from Last 3 Encounters:  01/05/24 224 lb 9.6 oz (101.9 kg)  04/17/22 208 lb (94.3 kg)  08/07/20 199 lb 3.2 oz (90.4 kg)    GEN: Well nourished, well developed in no acute distress NECK: No JVD; No carotid bruits CARDIAC: RRR, no murmurs, rubs, gallops RESPIRATORY:  Clear to auscultation without rales, wheezing or rhonchi  ABDOMEN: Soft, non-tender, non-distended EXTREMITIES:  No edema; No deformity   ASSESSMENT AND PLAN: .    Atypical Chest Pain Recent onset of intermittent chest pain in various locations, not exacerbated by exertion. Pain is possibly anxiety-induced given the patient's report of increased symptoms when thinking about it. Previous cardiac catheterization in 2021 showed normal coronary arteries. -If symptoms worsen, patient has been instructed to contact cardiology service, order a coronary CT. -Reassess in 6 weeks, if symptoms still persist, low threshold of ordering a coronary CT.  Hypertension Blood pressure elevated during visit, patient has not been taking amlodipine for a year or  two. -Restart amlodipine to manage blood pressure. -Continue current dose of 40mg  Tamasartan. -Recheck blood pressure in six weeks.        Dispo: Follow-up in 6 weeks  Signed, Azalee Course, PA

## 2024-01-05 NOTE — Patient Instructions (Signed)
 Medication Instructions:  NO CHANGES *If you need a refill on your cardiac medications before your next appointment, please call your pharmacy*   Lab Work: NO LABS If you have labs (blood work) drawn today and your tests are completely normal, you will receive your results only by: MyChart Message (if you have MyChart) OR A paper copy in the mail If you have any lab test that is abnormal or we need to change your treatment, we will call you to review the results.   Testing/Procedures: NO TESTING   Follow-Up: At [facility], you and your health needs are our priority.  As part of our continuing mission to provide you with exceptional heart care, we have created designated Provider Care Teams.  These Care Teams include your primary Cardiologist (physician) and Advanced Practice Providers (APPs -  Physician Assistants and Nurse Practitioners) who all work together to provide you with the care you need, when you need it.   Your next appointment:   6 week(s)  Provider:   Little Ishikawa, MD

## 2024-01-08 ENCOUNTER — Encounter: Payer: Self-pay | Admitting: Cardiology

## 2024-01-13 NOTE — Progress Notes (Unsigned)
 Cardiology Office Note:    Date:  01/15/2024   ID:  Andrew Delgado, DOB 31-Jan-1976, MRN [id number]  PCP:  Daisy Floro, MD  Cardiologist:  Little Ishikawa, MD  Electrophysiologist:  None   Referring MD: Daisy Floro, MD   Chief Complaint  Patient presents with   Dizziness   Pain    Patient stated the pain in his neck right side has gotten better since a week ago since he started the amlodipine but mild chest pain but he has noticed a change slowly for the better     History of Present Illness:    Andrew Delgado is a 48 y.o. male with a hx of asthma, hypertension who presents for follow-up.  He was referred by Dr. Tenny Craw for evaluation of chest pain, initially seen on 04/23/2020.  Cardiac catheterization on 04/26/2020 showed normal coronary arteries, normal LV systolic function, normal LVEDP.  Zio patch x 12 days 04/2022 showed 1 episode of NSVT lasting 5 beats.  Echocardiogram 06/17/2022 showed normal biventricular function, no significant valvular disease.  Since last clinic visit, he reports he has continue to have left-sided chest pain but has been improving.  States that pain improves with exercise, went for a 3 mile walk and symptoms resolved while he was exercising.  States that he feels like symptoms have been improving every day.  Chest pain occurs at rest and lasts for few minutes.  Reports BP 110s over 70s when he checked at home.  He does report some dizziness but denies any syncope.  No recent palpitations.  Denies any lower extremity edema.   Past Medical History:  Diagnosis Date   Abnormal PFT    Allergic rhinitis    Asthma    as a child   Chest pain    Family history of adverse reaction to anesthesia    it takes my mother a long time to come out of anesthesia and PONV   Headache    Hypertension    Hypoglycemic reaction    history"hypoglycemic"   Insomnia    Pneumonia    Pneumonia- severe case 4'16 (multiple rounds oral antibiotics).Using  Inhaler daily.Occ. residual cough.   PONV (postoperative nausea and vomiting)    Umbilical hernia    Wears glasses     Past Surgical History:  Procedure Laterality Date   ADENOIDECTOMY     CIRCUMCISION     INSERTION OF MESH N/A 07/13/2015   Procedure: INSERTION OF MESH;  Surgeon: Axel Filler, MD;  Location: [facility] OR;  Service: General;  Laterality: N/A;   LEFT HEART CATH AND CORONARY ANGIOGRAPHY N/A 04/26/2020   Procedure: LEFT HEART CATH AND CORONARY ANGIOGRAPHY;  Surgeon: Runell Gess, MD;  Location: [facility] INVASIVE CV LAB;  Service: Cardiovascular;  Laterality: N/A;   MYRINGOTOMY WITH TUBE PLACEMENT     x2 - 30 yrs ago   UMBILICAL HERNIA REPAIR N/A 07/13/2015   Procedure: LAPAROSCOPIC UMBILICAL HERNIA REPAIR WITH MESH  X 2 ;  Surgeon: Axel Filler, MD;  Location: [facility] OR;  Service: General;  Laterality: N/A;   WISDOM TOOTH EXTRACTION      Current Medications: Current Meds  Medication Sig   amLODipine (NORVASC) 5 MG tablet Take 1 tablet (5 mg total) by mouth daily.   azelastine (ASTELIN) 0.1 % nasal spray Place 1 spray into both nostrils 2 (two) times daily. Use in each nostril as directed   budesonide-formoterol (SYMBICORT) 160-4.5 MCG/ACT inhaler Inhale 2 puffs into the lungs 2 (  two) times daily.   Calcium Carb-Cholecalciferol (CALCIUM 600 + D PO) Take 1 tablet by mouth daily.   Cholecalciferol (VITAMIN D) 50 MCG (2000 UT) tablet Take 2,000 Units by mouth daily.   Dextromethorphan-guaiFENesin (MUCINEX DM MAXIMUM STRENGTH) 60-1200 MG TB12 Take 1 tablet by mouth 2 (two) times daily as needed (congestion).   fexofenadine (ALLEGRA) 180 MG tablet Take 180 mg by mouth daily.   fluticasone (FLONASE) 50 MCG/ACT nasal spray Place 1 spray into both nostrils in the morning and at bedtime.    meloxicam (MOBIC) 15 MG tablet Take 15 mg by mouth 3 (three) times daily as needed.   methocarbamol (ROBAXIN) 500 MG tablet Take 500 mg by mouth at bedtime as needed.   Multiple Vitamin (MULTIVITAMIN  WITH MINERALS) TABS tablet Take 1 tablet by mouth daily.   telmisartan (MICARDIS) 80 MG tablet Take 40 mg by mouth daily.   traZODone (DESYREL) 150 MG tablet Take 150 mg by mouth at bedtime.   zolpidem (AMBIEN) 10 MG tablet Take 10 mg by mouth at bedtime as needed for sleep.   Current Facility-Administered Medications for the 01/15/24 encounter (Office Visit) with Little Ishikawa, MD  Medication   sodium chloride flush (NS) 0.9 % injection 3 mL     Allergies:   Augmentin [amoxicillin-pot clavulanate], Chantix [varenicline], and Prednisone   Social History   Socioeconomic History   Marital status: Married    Spouse name: Belenda Cruise   Number of children: 5   Years of education: Not on file   Highest education level: Some college, no degree  Occupational History   Occupation: Emergency planning/management officer  Tobacco Use   Smoking status: Former    Current packs/day: 0.00    Average packs/day: 1 pack/day for 20.0 years (20.0 ttl pk-yrs)    Types: Cigarettes    Start date: 11/17/1990    Quit date: 11/17/2010    Years since quitting: 13.1   Smokeless tobacco: Former    Types: Engineer, drilling   Vaping status: Never Used  Substance and Sexual Activity   Alcohol use: Yes    Alcohol/week: 0.0 standard drinks of alcohol    Comment: occ.   Drug use: No   Sexual activity: Yes    Partners: Female    Comment: married  Other Topics Concern   Not on file  Social History Narrative   Pt right handed, he is married, lives with wife and 5 sons in a 2 story house. He drinks 1-2 cups of coffee on the weekends. He runs and bicycles 3-4 x a week.   Social Drivers of Corporate investment banker Strain: Not on file  Food Insecurity: Not on file  Transportation Needs: Not on file  Physical Activity: Not on file  Stress: Not on file  Social Connections: Not on file     Family History: The patient's family history includes Allergies in his sister; Anuerysm in his father; Heart disease in his  mother.  ROS:   Please see the history of present illness.    All other systems reviewed and are negative.  EKGs/Labs/Other Studies Reviewed:    The following studies were reviewed today:   EKG:  EKG is not ordered today.  The ekg ordered at recent clinic visit demonstrates normal sinus rhythm, rate 88, no ST/T abnormalities  Recent Labs: No results found for requested labs within last 365 days.  Recent Lipid Panel No results found for: "CHOL", "TRIG", "HDL", "CHOLHDL", "VLDL", "LDLCALC", "LDLDIRECT"  Physical Exam:  VS:  BP 133/79   Pulse 92   Ht 5\' 8"  (1.727 m)   Wt 225 lb 6.4 oz (102.2 kg)   SpO2 98%   BMI 34.27 kg/m     Wt Readings from Last 3 Encounters:  01/15/24 225 lb 6.4 oz (102.2 kg)  01/05/24 224 lb 9.6 oz (101.9 kg)  04/17/22 208 lb (94.3 kg)     GEN: Well nourished, well developed in no acute distress HEENT: Normal NECK: No JVD; No carotid bruits LYMPHATICS: No lymphadenopathy CARDIAC: RRR, no murmurs, rubs, gallops RESPIRATORY:  Clear to auscultation without rales, wheezing or rhonchi  ABDOMEN: Soft, non-tender, non-distended MUSCULOSKELETAL:  No edema; No deformity  SKIN: Warm and dry NEUROLOGIC:  Alert and oriented x 3 PSYCHIATRIC:  Normal affect   ASSESSMENT:    1. Chest pain of uncertain etiology   2. Lightheadedness   3. Essential hypertension     PLAN:    Chest pain: describes exertional chest pain that resolves with rest.  He does have significant CAD risk factors (hypertension, tobacco use, family history).  Cardiac catheterization shows normal coronary arteries.  Differential includes microvascular disease versus noncardiac chest pain, likely musculoskeletal.  Suspect likely noncardiac chest pain. -He is reporting recurrent chest pain but improves with exertion, suggesting noncardiac pain.  Suspect likely musculoskeletal.  States that pain has improved over the last week.  Will hold off on any further testing at this  time  Palpitations: Zio patch x 12 days 04/2022 showed 1 episode of NSVT lasting 5 beats.  Echocardiogram 06/17/2022 showed normal biventricular function, no significant valvular disease.  Hypertension: On telmisartan 40 mg daily and amlodipine 5 mg daily.  BP appears improved since starting amlodipine last week, asked to check BP twice daily for next week and let us know results  Lightheadedness: Check carotid duplex  RTC in 6 months   Medication Adjustments/Labs and Tests Ordered: Current medicines are reviewed at length with the patient today.  Concerns regarding medicines are outlined above.  Orders Placed This Encounter  Procedures   VAS US CAROTID   No orders of the defined types were placed in this encounter.   Patient Instructions  Medication Instructions:  Continue current medications *If you need a refill on your cardiac medications before your next appointment, please call your pharmacy*   Lab Work: none If you have labs (blood work) drawn today and your tests are completely normal, you will receive your results only by: MyChart Message (if you have MyChart) OR A paper copy in the mail If you have any lab test that is abnormal or we need to change your treatment, we will call you to review the results.   Testing/Procedures: Your physician has requested that you have a carotid duplex. This test is an ultrasound of the carotid arteries in your neck. It looks at blood flow through these arteries that supply the brain with blood. Allow one hour for this exam. There are no restrictions or special instructions.    Follow-Up: At [facility], you and your health needs are our priority.  As part of our continuing mission to provide you with exceptional heart care, we have created designated Provider Care Teams.  These Care Teams include your primary Cardiologist (physician) and Advanced Practice Providers (APPs -  Physician Assistants and Nurse Practitioners) who all  work together to provide you with the care you need, when you need it.  We recommend signing up for the patient portal called "MyChart".  Sign up  information is provided on this After Visit Summary.  MyChart is used to connect with patients for Virtual Visits (Telemedicine).  Patients are able to view lab/test results, encounter notes, upcoming appointments, etc.  Non-urgent messages can be sent to your provider as well.   To learn more about what you can do with MyChart, go to [email or web address].    Your next appointment:   6 month(s)  Provider:   Little Ishikawa, MD     Other Instructions Please check blood pressure twice a day for nest week and send those reading via mychart     PLEASE READ AND FOLLOW ATTACHED  SALTY 6        Signed, Little Ishikawa, MD  01/15/2024 6:05 PM    [facility]

## 2024-01-15 ENCOUNTER — Encounter: Payer: Self-pay | Admitting: Cardiology

## 2024-01-15 ENCOUNTER — Ambulatory Visit: Payer: 59 | Attending: Cardiology | Admitting: Cardiology

## 2024-01-15 VITALS — BP 133/79 | HR 92 | Ht 68.0 in | Wt 225.4 lb

## 2024-01-15 DIAGNOSIS — R42 Dizziness and giddiness: Secondary | ICD-10-CM

## 2024-01-15 DIAGNOSIS — R079 Chest pain, unspecified: Secondary | ICD-10-CM | POA: Diagnosis not present

## 2024-01-15 DIAGNOSIS — I10 Essential (primary) hypertension: Secondary | ICD-10-CM | POA: Diagnosis not present

## 2024-01-15 NOTE — Patient Instructions (Addendum)
 Medication Instructions:  Continue current medications *If you need a refill on your cardiac medications before your next appointment, please call your pharmacy*   Lab Work: none If you have labs (blood work) drawn today and your tests are completely normal, you will receive your results only by: MyChart Message (if you have MyChart) OR A paper copy in the mail If you have any lab test that is abnormal or we need to change your treatment, we will call you to review the results.   Testing/Procedures: Your physician has requested that you have a carotid duplex. This test is an ultrasound of the carotid arteries in your neck. It looks at blood flow through these arteries that supply the brain with blood. Allow one hour for this exam. There are no restrictions or special instructions.    Follow-Up: At [facility], you and your health needs are our priority.  As part of our continuing mission to provide you with exceptional heart care, we have created designated Provider Care Teams.  These Care Teams include your primary Cardiologist (physician) and Advanced Practice Providers (APPs -  Physician Assistants and Nurse Practitioners) who all work together to provide you with the care you need, when you need it.  We recommend signing up for the patient portal called "MyChart".  Sign up information is provided on this After Visit Summary.  MyChart is used to connect with patients for Virtual Visits (Telemedicine).  Patients are able to view lab/test results, encounter notes, upcoming appointments, etc.  Non-urgent messages can be sent to your provider as well.   To learn more about what you can do with MyChart, go to [email or web address].    Your next appointment:   6 month(s)  Provider:   Little Ishikawa, MD     Other Instructions Please check blood pressure twice a day for nest week and send those reading via mychart     PLEASE READ AND FOLLOW ATTACHED  SALTY 6

## 2024-01-28 ENCOUNTER — Other Ambulatory Visit: Payer: Self-pay | Admitting: Physician Assistant

## 2024-02-04 ENCOUNTER — Ambulatory Visit (HOSPITAL_COMMUNITY): Admission: RE | Admit: 2024-02-04 | Payer: 59 | Source: Ambulatory Visit | Attending: Cardiology | Admitting: Cardiology

## 2024-02-17 ENCOUNTER — Ambulatory Visit (HOSPITAL_COMMUNITY): Admission: RE | Admit: 2024-02-17 | Source: Ambulatory Visit

## 2024-02-29 ENCOUNTER — Ambulatory Visit (HOSPITAL_COMMUNITY)
Admission: RE | Admit: 2024-02-29 | Discharge: 2024-02-29 | Disposition: A | Discharge status: Home / Self Care | Source: Ambulatory Visit | Attending: Cardiology | Admitting: Cardiology

## 2024-02-29 DIAGNOSIS — R42 Dizziness and giddiness: Secondary | ICD-10-CM | POA: Diagnosis not present

## 2024-03-13 NOTE — Progress Notes (Deleted)
 Cardiology Office Note:    Date:  03/13/2024   ID:  Danniel Duverney, DOB 1975/12/31, MRN [id number]  PCP:  Jimmey Mould, MD  Cardiologist:  Wendie Hamburg, MD  Electrophysiologist:  None   Referring MD: Jimmey Mould, MD   No chief complaint on file.    History of Present Illness:    Andrew Delgado is a 48 y.o. male with a hx of asthma, hypertension who presents for follow-up.  He was referred by Dr. Avanell Bob for evaluation of chest pain, initially seen on 04/23/2020.  Cardiac catheterization on 04/26/2020 showed normal coronary arteries, normal LV systolic function, normal LVEDP.  Zio patch x 12 days 04/2022 showed 1 episode of NSVT lasting 5 beats.  Echocardiogram 06/17/2022 showed normal biventricular function, no significant valvular disease.  Since last clinic visit,  he reports he has continue to have left-sided chest pain but has been improving.  States that pain improves with exercise, went for a 3 mile walk and symptoms resolved while he was exercising.  States that he feels like symptoms have been improving every day.  Chest pain occurs at rest and lasts for few minutes.  Reports BP 110s over 70s when he checked at home.  He does report some dizziness but denies any syncope.  No recent palpitations.  Denies any lower extremity edema.   Past Medical History:  Diagnosis Date   Abnormal PFT    Allergic rhinitis    Asthma    as a child   Chest pain    Family history of adverse reaction to anesthesia    it takes my mother a long time to come out of anesthesia and PONV   Headache    Hypertension    Hypoglycemic reaction    history"hypoglycemic"   Insomnia    Pneumonia    Pneumonia- severe case 4'16 (multiple rounds oral antibiotics).Using Inhaler daily.Occ. residual cough.   PONV (postoperative nausea and vomiting)    Umbilical hernia    Wears glasses     Past Surgical History:  Procedure Laterality Date   ADENOIDECTOMY     CIRCUMCISION      INSERTION OF MESH N/A 07/13/2015   Procedure: INSERTION OF MESH;  Surgeon: Shela Derby, MD;  Location: [facility] OR;  Service: General;  Laterality: N/A;   LEFT HEART CATH AND CORONARY ANGIOGRAPHY N/A 04/26/2020   Procedure: LEFT HEART CATH AND CORONARY ANGIOGRAPHY;  Surgeon: Avanell Leigh, MD;  Location: [facility] INVASIVE CV LAB;  Service: Cardiovascular;  Laterality: N/A;   MYRINGOTOMY WITH TUBE PLACEMENT     x2 - 30 yrs ago   UMBILICAL HERNIA REPAIR N/A 07/13/2015   Procedure: LAPAROSCOPIC UMBILICAL HERNIA REPAIR WITH MESH  X 2 ;  Surgeon: Shela Derby, MD;  Location: [facility] OR;  Service: General;  Laterality: N/A;   WISDOM TOOTH EXTRACTION      Current Medications: No outpatient medications have been marked as taking for the 03/15/24 encounter (Appointment) with Wendie Hamburg, MD.   Current Facility-Administered Medications for the 03/15/24 encounter (Appointment) with Wendie Hamburg, MD  Medication   sodium chloride  flush (NS) 0.9 % injection 3 mL     Allergies:   Augmentin [amoxicillin-pot clavulanate], Chantix [varenicline], and Prednisone   Social History   Socioeconomic History   Marital status: Married    Spouse name: Amadeo Backers   Number of children: 5   Years of education: Not on file   Highest education level: Some college, no degree  Occupational  History   Occupation: Emergency planning/management officer  Tobacco Use   Smoking status: Former    Current packs/day: 0.00    Average packs/day: 1 pack/day for 20.0 years (20.0 ttl pk-yrs)    Types: Cigarettes    Start date: 11/17/1990    Quit date: 11/17/2010    Years since quitting: 13.3   Smokeless tobacco: Former    Types: Engineer, drilling   Vaping status: Never Used  Substance and Sexual Activity   Alcohol use: Yes    Alcohol/week: 0.0 standard drinks of alcohol    Comment: occ.   Drug use: No   Sexual activity: Yes    Partners: Female    Comment: married  Other Topics Concern   Not on file  Social History Narrative   Pt  right handed, he is married, lives with wife and 5 sons in a 2 story house. He drinks 1-2 cups of coffee on the weekends. He runs and bicycles 3-4 x a week.   Social Drivers of Corporate investment banker Strain: Not on file  Food Insecurity: Not on file  Transportation Needs: Not on file  Physical Activity: Not on file  Stress: Not on file  Social Connections: Not on file     Family History: The patient's family history includes Allergies in his sister; Anuerysm in his father; Heart disease in his mother.  ROS:   Please see the history of present illness.    All other systems reviewed and are negative.  EKGs/Labs/Other Studies Reviewed:    The following studies were reviewed today:   EKG:  EKG is not ordered today.  The ekg ordered at recent clinic visit demonstrates normal sinus rhythm, rate 88, no ST/T abnormalities  Recent Labs: No results found for requested labs within last 365 days.  Recent Lipid Panel No results found for: "CHOL", "TRIG", "HDL", "CHOLHDL", "VLDL", "LDLCALC", "LDLDIRECT"  Physical Exam:    VS:  There were no vitals taken for this visit.    Wt Readings from Last 3 Encounters:  01/15/24 225 lb 6.4 oz (102.2 kg)  01/05/24 224 lb 9.6 oz (101.9 kg)  04/17/22 208 lb (94.3 kg)     GEN: Well nourished, well developed in no acute distress HEENT: Normal NECK: No JVD; No carotid bruits LYMPHATICS: No lymphadenopathy CARDIAC: RRR, no murmurs, rubs, gallops RESPIRATORY:  Clear to auscultation without rales, wheezing or rhonchi  ABDOMEN: Soft, non-tender, non-distended MUSCULOSKELETAL:  No edema; No deformity  SKIN: Warm and dry NEUROLOGIC:  Alert and oriented x 3 PSYCHIATRIC:  Normal affect   ASSESSMENT:    No diagnosis found.   PLAN:    Chest pain: describes exertional chest pain that resolves with rest.  He does have significant CAD risk factors (hypertension, tobacco use, family history).  Cardiac catheterization shows normal coronary  arteries.  Differential includes microvascular disease versus noncardiac chest pain, likely musculoskeletal.  Suspect likely noncardiac chest pain. -He is reporting recurrent chest pain but improves with exertion, suggesting noncardiac pain.  Suspect likely musculoskeletal.  States that pain has improved over the last week.  Will hold off on any further testing at this time  Palpitations: Zio patch x 12 days 04/2022 showed 1 episode of NSVT lasting 5 beats.  Echocardiogram 06/17/2022 showed normal biventricular function, no significant valvular disease.  Hypertension: On telmisartan 40 mg daily and amlodipine  5 mg daily.  BP appears improved since starting amlodipine  last week, asked to check BP twice daily for next week and let us   know results  Lightheadedness: Normal carotid arteries on duplex 02/29/2024  RTC in 6 months***   Medication Adjustments/Labs and Tests Ordered: Current medicines are reviewed at length with the patient today.  Concerns regarding medicines are outlined above.  No orders of the defined types were placed in this encounter.  No orders of the defined types were placed in this encounter.   There are no Patient Instructions on file for this visit.   Signed, Wendie Hamburg, MD  03/13/2024 1:56 PM    [facility]

## 2024-03-15 ENCOUNTER — Ambulatory Visit: Payer: 59 | Admitting: Cardiology

## 2024-04-23 ENCOUNTER — Emergency Department (HOSPITAL_BASED_OUTPATIENT_CLINIC_OR_DEPARTMENT_OTHER)
Admission: EM | Admit: 2024-04-23 | Discharge: 2024-04-24 | Disposition: A | Discharge status: Home / Self Care | Attending: Emergency Medicine | Admitting: Emergency Medicine

## 2024-04-23 ENCOUNTER — Encounter (HOSPITAL_BASED_OUTPATIENT_CLINIC_OR_DEPARTMENT_OTHER): Payer: Self-pay | Admitting: Emergency Medicine

## 2024-04-23 ENCOUNTER — Other Ambulatory Visit: Payer: Self-pay

## 2024-04-23 DIAGNOSIS — Z79899 Other long term (current) drug therapy: Secondary | ICD-10-CM | POA: Insufficient documentation

## 2024-04-23 DIAGNOSIS — K56600 Partial intestinal obstruction, unspecified as to cause: Secondary | ICD-10-CM

## 2024-04-23 DIAGNOSIS — R1030 Lower abdominal pain, unspecified: Secondary | ICD-10-CM | POA: Diagnosis present

## 2024-04-23 DIAGNOSIS — I10 Essential (primary) hypertension: Secondary | ICD-10-CM | POA: Diagnosis not present

## 2024-04-23 LAB — COMPREHENSIVE METABOLIC PANEL WITH GFR
ALT: 28 U/L (ref 0–44)
AST: 24 U/L (ref 15–41)
Albumin: 4.9 g/dL (ref 3.5–5.0)
Alkaline Phosphatase: 68 U/L (ref 38–126)
Anion gap: 16 — ABNORMAL HIGH (ref 5–15)
BUN: 21 mg/dL — ABNORMAL HIGH (ref 6–20)
CO2: 22 mmol/L (ref 22–32)
Calcium: 10.1 mg/dL (ref 8.9–10.3)
Chloride: 101 mmol/L (ref 98–111)
Creatinine, Ser: 0.78 mg/dL (ref 0.61–1.24)
GFR, Estimated: >60 mL/min (ref >60)
Glucose, Bld: 122 mg/dL — ABNORMAL HIGH (ref 70–99)
Potassium: 3.6 mmol/L (ref 3.5–5.1)
Sodium: 139 mmol/L (ref 135–145)
Total Bilirubin: 0.6 mg/dL (ref 0.0–1.2)
Total Protein: 7.4 g/dL (ref 6.5–8.1)

## 2024-04-23 LAB — URINALYSIS, ROUTINE W REFLEX MICROSCOPIC
Bilirubin Urine: NEGATIVE
Glucose, UA: NEGATIVE mg/dL
Hgb urine dipstick: NEGATIVE
Ketones, ur: >=80 mg/dL — AB
Leukocytes,Ua: NEGATIVE
Nitrite: NEGATIVE
Protein, ur: 30 mg/dL — AB
Specific Gravity, Urine: 1.02 (ref 1.005–1.030)
pH: 8.5 — ABNORMAL HIGH (ref 5.0–8.0)

## 2024-04-23 LAB — CBC WITH DIFFERENTIAL/PLATELET
Abs Immature Granulocytes: 0.34 10*3/uL — ABNORMAL HIGH (ref 0.00–0.07)
Basophils Absolute: 0.1 10*3/uL (ref 0.0–0.1)
Basophils Relative: 0 %
Eosinophils Absolute: 0 10*3/uL (ref 0.0–0.5)
Eosinophils Relative: 0 %
HCT: 46.2 % (ref 39.0–52.0)
Hemoglobin: 16.7 g/dL (ref 13.0–17.0)
Immature Granulocytes: 2 %
Lymphocytes Relative: 5 %
Lymphs Abs: 0.8 10*3/uL (ref 0.7–4.0)
MCH: 33.8 pg (ref 26.0–34.0)
MCHC: 36.1 g/dL — ABNORMAL HIGH (ref 30.0–36.0)
MCV: 93.5 fL (ref 80.0–100.0)
Monocytes Absolute: 0.9 10*3/uL (ref 0.1–1.0)
Monocytes Relative: 5 %
Neutro Abs: 15.7 10*3/uL — ABNORMAL HIGH (ref 1.7–7.7)
Neutrophils Relative %: 88 %
Platelets: 208 10*3/uL (ref 150–400)
RBC: 4.94 MIL/uL (ref 4.22–5.81)
RDW: 11.3 % — ABNORMAL LOW (ref 11.5–15.5)
WBC: 17.8 10*3/uL — ABNORMAL HIGH (ref 4.0–10.5)
nRBC: 0 % (ref 0.0–0.2)

## 2024-04-23 LAB — URINALYSIS, MICROSCOPIC (REFLEX)

## 2024-04-23 LAB — CBG MONITORING, ED: Glucose-Capillary: 151 mg/dL — ABNORMAL HIGH (ref 70–99)

## 2024-04-23 LAB — LIPASE, BLOOD: Lipase: 19 U/L (ref 11–51)

## 2024-04-23 MED ORDER — ONDANSETRON HCL 4 MG/2ML IJ SOLN
4.0000 mg | Freq: Once | INTRAMUSCULAR | Status: AC
  Administered 2024-04-23: 4 mg via INTRAVENOUS
  Filled 2024-04-23: qty 2

## 2024-04-23 MED ORDER — HYDROMORPHONE HCL 1 MG/ML IJ SOLN
1.0000 mg | Freq: Once | INTRAMUSCULAR | Status: AC
  Administered 2024-04-23: 1 mg via INTRAVENOUS
  Filled 2024-04-23: qty 1

## 2024-04-23 NOTE — ED Triage Notes (Signed)
 Pt reports lower abd pain and NV today

## 2024-04-23 NOTE — ED Provider Notes (Signed)
 [place] [facility] Provider Note   CSN: [id number] Arrival date & time: 04/23/24  1936     History  Chief Complaint  Patient presents with   Abdominal Pain   Emesis    Andrew Delgado is a 48 y.o. male.  Patient is a 48 year old male with history of diverticulosis, hypertension, prior hernia repair.  Patient presenting today with complaints of lower abdominal pain.  He reports eating a large amount of peanuts yesterday, then symptoms began this morning.  Symptoms are worsening.  He denies any diarrhea or constipation.  No fevers or chills.  Pain worse with movement and palpation with no alleviating factors.       Home Medications Prior to Admission medications   Medication Sig Start Date End Date Taking? Authorizing Provider  amLODipine  (NORVASC ) 5 MG tablet TAKE 1 TABLET (5 MG TOTAL) BY MOUTH DAILY. 01/28/24   Wendie Hamburg, MD  azelastine (ASTELIN) 0.1 % nasal spray Place 1 spray into both nostrils 2 (two) times daily. Use in each nostril as directed    [provider]  budesonide -formoterol  (SYMBICORT ) 160-4.5 MCG/ACT inhaler Inhale 2 puffs into the lungs 2 (two) times daily.    [provider]  Calcium Carb-Cholecalciferol (CALCIUM 600 + D PO) Take 1 tablet by mouth daily.    [provider]  Cholecalciferol (VITAMIN D) 50 MCG (2000 UT) tablet Take 2,000 Units by mouth daily.    [provider]  Dextromethorphan-guaiFENesin (MUCINEX DM MAXIMUM STRENGTH) 60-1200 MG TB12 Take 1 tablet by mouth 2 (two) times daily as needed (congestion).    [provider]  fexofenadine (ALLEGRA) 180 MG tablet Take 180 mg by mouth daily.    [provider]  fluticasone (FLONASE) 50 MCG/ACT nasal spray Place 1 spray into both nostrils in the morning and at bedtime.     [provider]  meloxicam (MOBIC) 15 MG tablet Take 15 mg by mouth 3 (three) times daily as needed.    [provider]  methocarbamol (ROBAXIN) 500 MG tablet Take 500 mg by mouth at bedtime as needed.    [provider]  Multiple Vitamin (MULTIVITAMIN WITH MINERALS) TABS tablet Take 1 tablet by mouth daily.    [provider]  nitroGLYCERIN  (NITROSTAT ) 0.4 MG SL tablet Place 1 tablet (0.4 mg total) under the tongue every 5 (five) minutes as needed for chest pain. Patient not taking: Reported on 01/15/2024 01/05/24 04/04/24  Meng, Hao, PA  telmisartan (MICARDIS) 80 MG tablet Take 40 mg by mouth daily.    [provider]  traZODone (DESYREL) 150 MG tablet Take 150 mg by mouth at bedtime.    [provider]  zolpidem (AMBIEN) 10 MG tablet Take 10 mg by mouth at bedtime as needed for sleep.    [provider]      Allergies    Augmentin [amoxicillin-pot clavulanate], Chantix [varenicline], and Prednisone    Review of Systems   Review of Systems  All other systems reviewed and are negative.   Physical Exam Updated Vital Signs BP (!) 145/56   Pulse 84   Temp 97.9 F (36.6 C) (Oral)   Resp 18   Ht 5\' 8"  (1.727 m)   Wt 100.7 kg   SpO2 100%   BMI 33.75 kg/m  Physical Exam Vitals and nursing note reviewed.  Constitutional:      General: He is not in acute distress.    Appearance: He is well-developed. He  is not diaphoretic.  HENT:     Head: Normocephalic and atraumatic.  Cardiovascular:     Rate and Rhythm: Normal rate and regular rhythm.     Heart sounds: No murmur heard.    No friction rub.  Pulmonary:     Effort: Pulmonary effort is normal. No respiratory distress.     Breath sounds: Normal breath sounds. No wheezing or rales.  Abdominal:     General: Bowel sounds are normal. There is no distension.     Palpations: Abdomen is soft.     Tenderness: There is abdominal tenderness in the suprapubic area. There is no right CVA tenderness, left CVA tenderness, guarding or rebound.  Musculoskeletal:        General: Normal range of motion.      Cervical back: Normal range of motion and neck supple.  Skin:    General: Skin is warm and dry.  Neurological:     Mental Status: He is alert and oriented to person, place, and time.     Coordination: Coordination normal.     ED Results / Procedures / Treatments   Labs (all labs ordered are listed, but only abnormal results are displayed) Labs Reviewed  COMPREHENSIVE METABOLIC PANEL WITH GFR - Abnormal; Notable for the following components:      Result Value   Glucose, Bld 122 (*)    BUN 21 (*)    Anion gap 16 (*)    All other components within normal limits  URINALYSIS, ROUTINE W REFLEX MICROSCOPIC - Abnormal; Notable for the following components:   pH 8.5 (*)    Ketones, ur >=80 (*)    Protein, ur 30 (*)    All other components within normal limits  CBC WITH DIFFERENTIAL/PLATELET - Abnormal; Notable for the following components:   WBC 17.8 (*)    MCHC 36.1 (*)    RDW 11.3 (*)    Neutro Abs 15.7 (*)    Abs Immature Granulocytes 0.34 (*)    All other components within normal limits  URINALYSIS, MICROSCOPIC (REFLEX) - Abnormal; Notable for the following components:   Bacteria, UA RARE (*)    All other components within normal limits  CBG MONITORING, ED - Abnormal; Notable for the following components:   Glucose-Capillary 151 (*)    All other components within normal limits  LIPASE, BLOOD  CBG MONITORING, ED    EKG None  Radiology No results found.  Procedures Procedures  {Document cardiac monitor, telemetry assessment procedure when appropriate:1}  Medications Ordered in ED Medications  HYDROmorphone  (DILAUDID ) injection 1 mg (1 mg Intravenous Given 04/23/24 2252)  ondansetron  (ZOFRAN ) injection 4 mg (4 mg Intravenous Given 04/23/24 2241)    ED Course/ Medical Decision Making/ A&P   {   Click here for ABCD2, HEART and other calculatorsREFRESH Note before signing :1}                              Medical Decision Making Amount and/or Complexity of Data  Reviewed Labs: ordered. Radiology: ordered.   ***  {Document critical care time when appropriate:1} {Document review of labs and clinical decision tools ie heart score, Chads2Vasc2 etc:1}  {Document your independent review of radiology images, and any outside records:1} {Document your discussion with family members, caretakers, and with consultants:1} {Document social determinants of health affecting pt's care:1} {Document your decision making why or why not admission, treatments were needed:1} Final Clinical Impression(s) / ED Diagnoses Final diagnoses:  None    Rx / DC Orders ED Discharge Orders     None

## 2024-04-23 NOTE — ED Notes (Signed)
 Pt sts "my blood sugar is low"; he did not check glucose at home, but feels it is low

## 2024-04-24 ENCOUNTER — Encounter (HOSPITAL_BASED_OUTPATIENT_CLINIC_OR_DEPARTMENT_OTHER): Payer: Self-pay

## 2024-04-24 ENCOUNTER — Emergency Department (HOSPITAL_BASED_OUTPATIENT_CLINIC_OR_DEPARTMENT_OTHER)

## 2024-04-24 MED ORDER — HYDROCODONE-ACETAMINOPHEN 5-325 MG PO TABS
2.0000 | ORAL_TABLET | Freq: Once | ORAL | Status: AC
  Administered 2024-04-24: 2 via ORAL
  Filled 2024-04-24: qty 2

## 2024-04-24 MED ORDER — HYDROCODONE-ACETAMINOPHEN 5-325 MG PO TABS: 2.0000 | ORAL_TABLET | ORAL | 0 refills | Status: AC | PRN

## 2024-04-24 MED ORDER — ONDANSETRON 4 MG PO TBDP
8.0000 mg | ORAL_TABLET | Freq: Once | ORAL | Status: AC
  Administered 2024-04-24: 8 mg via ORAL
  Filled 2024-04-24: qty 2

## 2024-04-24 MED ORDER — IOHEXOL 300 MG/ML  SOLN
100.0000 mL | Freq: Once | INTRAMUSCULAR | Status: AC | PRN
  Administered 2024-04-24: 100 mL via INTRAVENOUS

## 2024-04-24 MED ORDER — ONDANSETRON 8 MG PO TBDP: ORAL_TABLET | ORAL | 0 refills | Status: AC

## 2024-04-24 NOTE — ED Notes (Signed)
 Patient transported to CT

## 2024-04-24 NOTE — ED Notes (Signed)
 Patient returned from CT

## 2024-04-24 NOTE — Discharge Instructions (Signed)
 Begin taking Zofran  as prescribed as needed for nausea and hydrocodone as prescribed as needed for pain.  Return to the emergency department if you develop recurrent vomiting, recurrent abdominal pain, high fevers, or for other new and concerning symptoms.

## 2024-04-24 NOTE — ED Notes (Signed)
 ED Provider at bedside. Patient/wife provided updates.

## 2024-06-24 ENCOUNTER — Ambulatory Visit: Admitting: Cardiology

## 2024-07-06 NOTE — Progress Notes (Unsigned)
 Cardiology Office Note:    Date:  07/07/2024   ID:  Dandrae, Kustra 1976/06/07, MRN [id number]  PCP:  Okey Carlin Redbird, MD  Cardiologist:  Lonni LITTIE Nanas, MD  Electrophysiologist:  None   Referring MD: Okey Carlin Redbird, MD   Chief Complaint  Patient presents with   Chest Pain     History of Present Illness:    Andrew Delgado is a 48 y.o. male with a hx of asthma, hypertension who presents for follow-up.  He was referred by Dr. Okey for evaluation of chest pain, initially seen on 04/23/2020.  Cardiac catheterization on 04/26/2020 showed normal coronary arteries, normal LV systolic function, normal LVEDP.  Zio patch x 12 days 04/2022 showed 1 episode of NSVT lasting 5 beats.  Echocardiogram 06/17/2022 showed normal biventricular function, no significant valvular disease.  Since last clinic visit, he reports he is doing okay.  States that once or twice a week will have chest pain that lasts for 30 seconds to a minute.  Unrelated to exertion.  Reports some lightheadedness but no syncope.  He denies any dyspnea or lower extremity edema.  Reports rare palpitations.  Past Medical History:  Diagnosis Date   Abnormal PFT    Allergic rhinitis    Asthma    as a child   Chest pain    Family history of adverse reaction to anesthesia    it takes my mother a long time to come out of anesthesia and PONV   Headache    Hypertension    Hypoglycemic reaction    historyhypoglycemic   Insomnia    Pneumonia    Pneumonia- severe case 4'16 (multiple rounds oral antibiotics).Using Inhaler daily.Occ. residual cough.   PONV (postoperative nausea and vomiting)    Umbilical hernia    Wears glasses     Past Surgical History:  Procedure Laterality Date   ADENOIDECTOMY     CIRCUMCISION     INSERTION OF MESH N/A 07/13/2015   Procedure: INSERTION OF MESH;  Surgeon: Lynda Leos, MD;  Location: [facility] OR;  Service: General;  Laterality: N/A;   LEFT HEART CATH AND CORONARY  ANGIOGRAPHY N/A 04/26/2020   Procedure: LEFT HEART CATH AND CORONARY ANGIOGRAPHY;  Surgeon: Court Dorn JINNY, MD;  Location: [facility] INVASIVE [facility];  Service: Cardiovascular;  Laterality: N/A;   MYRINGOTOMY WITH TUBE PLACEMENT     x2 - 30 yrs ago   UMBILICAL HERNIA REPAIR N/A 07/13/2015   Procedure: LAPAROSCOPIC UMBILICAL HERNIA REPAIR WITH MESH  X 2 ;  Surgeon: Lynda Leos, MD;  Location: [facility] OR;  Service: General;  Laterality: N/A;   WISDOM TOOTH EXTRACTION      Current Medications: Current Meds  Medication Sig   amLODipine  (NORVASC ) 5 MG tablet TAKE 1 TABLET (5 MG TOTAL) BY MOUTH DAILY.   azelastine (ASTELIN) 0.1 % nasal spray Place 1 spray into both nostrils 2 (two) times daily. Use in each nostril as directed   budesonide -formoterol  (SYMBICORT ) 160-4.5 MCG/ACT inhaler Inhale 2 puffs into the lungs 2 (two) times daily.   Calcium Carb-Cholecalciferol (CALCIUM 600 + D PO) Take 1 tablet by mouth daily.   Cholecalciferol (VITAMIN D) 50 MCG (2000 UT) tablet Take 2,000 Units by mouth daily.   Dextromethorphan-guaiFENesin (MUCINEX DM MAXIMUM STRENGTH) 60-1200 MG TB12 Take 1 tablet by mouth 2 (two) times daily as needed (congestion).   fexofenadine (ALLEGRA) 180 MG tablet Take 180 mg by mouth daily.   fluticasone (FLONASE) 50 MCG/ACT nasal spray Place 1  spray into both nostrils in the morning and at bedtime.    HYDROcodone -acetaminophen  (NORCO/VICODIN) 5-325 MG tablet Take 2 tablets by mouth every 4 (four) hours as needed.   meloxicam (MOBIC) 15 MG tablet Take 15 mg by mouth 3 (three) times daily as needed.   methocarbamol (ROBAXIN) 500 MG tablet Take 500 mg by mouth at bedtime as needed.   Multiple Vitamin (MULTIVITAMIN WITH MINERALS) TABS tablet Take 1 tablet by mouth daily.   nitroGLYCERIN  (NITROSTAT ) 0.4 MG SL tablet Place 1 tablet (0.4 mg total) under the tongue every 5 (five) minutes as needed for chest pain.   ondansetron  (ZOFRAN -ODT) 8 MG disintegrating tablet 8mg  ODT q4 hours prn nausea    telmisartan (MICARDIS) 80 MG tablet Take 40 mg by mouth daily.   traZODone (DESYREL) 150 MG tablet Take 150 mg by mouth at bedtime.   zolpidem (AMBIEN) 10 MG tablet Take 10 mg by mouth at bedtime as needed for sleep.   Current Facility-Administered Medications for the 07/07/24 encounter (Office Visit) with Kate Lonni CROME, MD  Medication   sodium chloride  flush (NS) 0.9 % injection 3 mL     Allergies:   Augmentin [amoxicillin-pot clavulanate], Chantix [varenicline], and Prednisone   Social History   Socioeconomic History   Marital status: Married    Spouse name: Allean   Number of children: 5   Years of education: Not on file   Highest education level: Some college, no degree  Occupational History   Occupation: Emergency planning/management officer  Tobacco Use   Smoking status: Former    Current packs/day: 0.00    Average packs/day: 1 pack/day for 20.0 years (20.0 ttl pk-yrs)    Types: Cigarettes    Start date: 11/17/1990    Quit date: 11/17/2010    Years since quitting: 13.6   Smokeless tobacco: Former    Types: Engineer, drilling   Vaping status: Never Used  Substance and Sexual Activity   Alcohol use: Yes    Alcohol/week: 0.0 standard drinks of alcohol    Comment: occ.   Drug use: No   Sexual activity: Yes    Partners: Female    Comment: married  Other Topics Concern   Not on file  Social History Narrative   Pt right handed, he is married, lives with wife and 5 sons in a 2 story house. He drinks 1-2 cups of coffee on the weekends. He runs and bicycles 3-4 x a week.   Social Drivers of Health   Financial Resource Strain: Medium Risk (05/07/2024)   Received from [facility]   Overall Financial Resource Strain (CARDIA)    Difficulty of Paying Living Expenses: Somewhat hard  Food Insecurity: No Food Insecurity (05/07/2024)   Received from [facility]   Hunger Vital Sign    Within the past 12 months, you worried that your food would run out before you got the money to buy more.:  Never true    Within the past 12 months, the food you bought just didn't last and you didn't have money to get more.: Never true  Transportation Needs: No Transportation Needs (05/07/2024)   Received from [facility] - Transportation    Lack of Transportation (Medical): No    Lack of Transportation (Non-Medical): No  Physical Activity: Insufficiently Active (05/07/2024)   Received from [facility]   Exercise Vital Sign    On average, how many days per week do you engage in moderate to strenuous exercise (like a brisk walk)?:  2 days    On average, how many minutes do you engage in exercise at this level?: 40 min  Stress: No Stress Concern Present (05/07/2024)   Received from [facility] - Occupational Stress Questionnaire    Feeling of Stress : Only a little  Social Connections: Socially Integrated (05/07/2024)   Received from [facility]   Social Network    How would you rate your social network (family, work, friends)?: Good participation with social networks     Family History: The patient's family history includes Allergies in his sister; Anuerysm in his father; Heart disease in his mother.  ROS:   Please see the history of present illness.    All other systems reviewed and are negative.  EKGs/Labs/Other Studies Reviewed:    The following studies were reviewed today:   EKG:   07/07/24: NSR, RATE 90,  Recent Labs: 04/23/2024: ALT 28; BUN 21; Creatinine, Ser 0.78; Hemoglobin 16.7; Platelets 208; Potassium 3.6; Sodium 139  Recent Lipid Panel No results found for: CHOL, TRIG, HDL, CHOLHDL, VLDL, LDLCALC, LDLDIRECT  Physical Exam:    VS:  BP 124/80 (BP Location: Left Arm, Patient Position: Sitting, Cuff Size: Normal)   Pulse 90   Ht 5' 8 (1.727 m)   Wt 225 lb (102.1 kg)   SpO2 99%   BMI 34.21 kg/m     Wt Readings from Last 3 Encounters:  07/07/24 225 lb (102.1 kg)  04/23/24 222 lb (100.7 kg)   01/15/24 225 lb 6.4 oz (102.2 kg)     GEN: Well nourished, well developed in no acute distress HEENT: Normal NECK: No JVD; No carotid bruits LYMPHATICS: No lymphadenopathy CARDIAC: RRR, no murmurs, rubs, gallops RESPIRATORY:  Clear to auscultation without rales, wheezing or rhonchi  ABDOMEN: Soft, non-tender, non-distended MUSCULOSKELETAL:  No edema; No deformity  SKIN: Warm and dry NEUROLOGIC:  Alert and oriented x 3 PSYCHIATRIC:  Normal affect   ASSESSMENT:    1. Chest pain of uncertain etiology   2. Essential hypertension   3. Hyperlipidemia, unspecified hyperlipidemia type   4. Palpitations      PLAN:    Chest pain: describes exertional chest pain that resolves with rest.  He does have significant CAD risk factors (hypertension, tobacco use, family history).  Cardiac catheterization shows normal coronary arteries.  Differential includes microvascular disease versus noncardiac chest pain, likely musculoskeletal.  Suspect likely noncardiac chest pain.  Palpitations: Zio patch x 12 days 04/2022 showed 1 episode of NSVT lasting 5 beats.  Echocardiogram 06/17/2022 showed normal biventricular function, no significant valvular disease.  Hypertension: On telmisartan 40 mg daily and amlodipine  5 mg daily.  Appears controlled.   Lightheadedness: Normal carotid arteries on duplex 02/2024  Hyperlipidemia: LDL 128 on 08/06/2023.  Check calcium score to guide how aggressive to be in lowering cholesterol  RTC in 1 year   Medication Adjustments/Labs and Tests Ordered: Current medicines are reviewed at length with the patient today.  Concerns regarding medicines are outlined above.  Orders Placed This Encounter  Procedures   CT CARDIAC SCORING (SELF PAY ONLY)   EKG 12-Lead   No orders of the defined types were placed in this encounter.   Patient Instructions  Medication Instructions:  Continue current medications *If you need a refill on your cardiac medications before your next  appointment, please call your pharmacy*  Lab Work: none If you have labs (blood work) drawn today and your tests are completely normal, you will receive your  results only by: MyChart Message (if you have MyChart) OR A paper copy in the mail If you have any lab test that is abnormal or we need to change your treatment, we will call you to review the results.  Testing/Procedures:  Dr. Kate has ordered a CT coronary calcium score.   Test locations:  [facility] at [facility]  [facility] at [facility]  This is $99 out of pocket.   Coronary CalciumScan A coronary calcium scan is an imaging test used to look for deposits of calcium and other fatty materials (plaques) in the inner lining of the blood vessels of the heart (coronary arteries). These deposits of calcium and plaques can partly clog and narrow the coronary arteries without producing any symptoms or warning signs. This puts a person at risk for a heart attack. This test can detect these deposits before symptoms develop. Tell a health care provider about: Any allergies you have. All medicines you are taking, including vitamins, herbs, eye drops, creams, and over-the-counter medicines. Any problems you or family members have had with anesthetic medicines. Any blood disorders you have. Any surgeries you have had. Any medical conditions you have. Whether you are pregnant or may be pregnant. What are the risks? Generally, this is a safe procedure. However, problems may occur, including: Harm to a pregnant woman and her unborn baby. This test involves the use of radiation. Radiation exposure can be dangerous to a pregnant woman and her unborn baby. If you are pregnant, you generally should not have this procedure done. Slight increase in the risk of cancer. This is because of the radiation involved in the test. What happens  before the procedure? No preparation is needed for this procedure. What happens during the procedure? You will undress and remove any jewelry around your neck or chest. You will put on a hospital gown. Sticky electrodes will be placed on your chest. The electrodes will be connected to an electrocardiogram (ECG) machine to record a tracing of the electrical activity of your heart. A CT scanner will take pictures of your heart. During this time, you will be asked to lie still and hold your breath for 2-3 seconds while a picture of your heart is being taken. The procedure may vary among health care providers and hospitals. What happens after the procedure? You can get dressed. You can return to your normal activities. It is up to you to get the results of your test. Ask your health care provider, or the department that is doing the test, when your results will be ready. Summary A coronary calcium scan is an imaging test used to look for deposits of calcium and other fatty materials (plaques) in the inner lining of the blood vessels of the heart (coronary arteries). Generally, this is a safe procedure. Tell your health care provider if you are pregnant or may be pregnant. No preparation is needed for this procedure. A CT scanner will take pictures of your heart. You can return to your normal activities after the scan is done. This information is not intended to replace advice given to you by your health care provider. Make sure you discuss any questions you have with your health care provider. Document Released: 05/01/2008 Document Revised: 09/22/2016 Document Reviewed: 09/22/2016 Elsevier Interactive Patient Education  2017 [employer].   Follow-Up: At [facility], you and your health needs are our priority.  As part of our  continuing mission to provide you with exceptional heart care, our providers are all part of one team.  This team includes your primary Cardiologist (physician)  and Advanced Practice Providers or APPs (Physician Assistants and Nurse Practitioners) who all work together to provide you with the care you need, when you need it.  Your next appointment:   1 year  Provider:   Dr. Kate  We recommend signing up for the patient portal called MyChart.  Sign up information is provided on this After Visit Summary.  MyChart is used to connect with patients for Virtual Visits (Telemedicine).  Patients are able to view lab/test results, encounter notes, upcoming appointments, etc.  Non-urgent messages can be sent to your provider as well.   To learn more about what you can do with MyChart, go to [email or web address].   Other Instructions none       Signed, Lonni LITTIE Kate, MD  07/07/2024 5:42 PM    [facility]

## 2024-07-07 ENCOUNTER — Ambulatory Visit: Attending: Cardiology | Admitting: Cardiology

## 2024-07-07 ENCOUNTER — Encounter: Payer: Self-pay | Admitting: Cardiology

## 2024-07-07 VITALS — BP 124/80 | HR 90 | Ht 68.0 in | Wt 225.0 lb

## 2024-07-07 DIAGNOSIS — E785 Hyperlipidemia, unspecified: Secondary | ICD-10-CM

## 2024-07-07 DIAGNOSIS — R002 Palpitations: Secondary | ICD-10-CM | POA: Diagnosis not present

## 2024-07-07 DIAGNOSIS — R079 Chest pain, unspecified: Secondary | ICD-10-CM

## 2024-07-07 DIAGNOSIS — I10 Essential (primary) hypertension: Secondary | ICD-10-CM | POA: Diagnosis not present

## 2024-07-07 NOTE — Patient Instructions (Signed)
 Medication Instructions:  Continue current medications *If you need a refill on your cardiac medications before your next appointment, please call your pharmacy*  Lab Work: none If you have labs (blood work) drawn today and your tests are completely normal, you will receive your results only by: MyChart Message (if you have MyChart) OR A paper copy in the mail If you have any lab test that is abnormal or we need to change your treatment, we will call you to review the results.  Testing/Procedures:  Dr. Kate has ordered a CT coronary calcium score.   Test locations:  [facility] at [facility]  [facility] at [facility]  This is $99 out of pocket.   Coronary CalciumScan A coronary calcium scan is an imaging test used to look for deposits of calcium and other fatty materials (plaques) in the inner lining of the blood vessels of the heart (coronary arteries). These deposits of calcium and plaques can partly clog and narrow the coronary arteries without producing any symptoms or warning signs. This puts a person at risk for a heart attack. This test can detect these deposits before symptoms develop. Tell a health care provider about: Any allergies you have. All medicines you are taking, including vitamins, herbs, eye drops, creams, and over-the-counter medicines. Any problems you or family members have had with anesthetic medicines. Any blood disorders you have. Any surgeries you have had. Any medical conditions you have. Whether you are pregnant or may be pregnant. What are the risks? Generally, this is a safe procedure. However, problems may occur, including: Harm to a pregnant woman and her unborn baby. This test involves the use of radiation. Radiation exposure can be dangerous to a pregnant woman and her unborn baby. If you are pregnant, you generally should not have this  procedure done. Slight increase in the risk of cancer. This is because of the radiation involved in the test. What happens before the procedure? No preparation is needed for this procedure. What happens during the procedure? You will undress and remove any jewelry around your neck or chest. You will put on a hospital gown. Sticky electrodes will be placed on your chest. The electrodes will be connected to an electrocardiogram (ECG) machine to record a tracing of the electrical activity of your heart. A CT scanner will take pictures of your heart. During this time, you will be asked to lie still and hold your breath for 2-3 seconds while a picture of your heart is being taken. The procedure may vary among health care providers and hospitals. What happens after the procedure? You can get dressed. You can return to your normal activities. It is up to you to get the results of your test. Ask your health care provider, or the department that is doing the test, when your results will be ready. Summary A coronary calcium scan is an imaging test used to look for deposits of calcium and other fatty materials (plaques) in the inner lining of the blood vessels of the heart (coronary arteries). Generally, this is a safe procedure. Tell your health care provider if you are pregnant or may be pregnant. No preparation is needed for this procedure. A CT scanner will take pictures of your heart. You can return to your normal activities after the scan is done. This information is not intended to replace advice given to you by your health care provider. Make sure you discuss any  questions you have with your health care provider. Document Released: 05/01/2008 Document Revised: 09/22/2016 Document Reviewed: 09/22/2016 Elsevier Interactive Patient Education  2017 [employer].   Follow-Up: At [facility], you and your health needs are our priority.  As part of our continuing mission to provide you with  exceptional heart care, our providers are all part of one team.  This team includes your primary Cardiologist (physician) and Advanced Practice Providers or APPs (Physician Assistants and Nurse Practitioners) who all work together to provide you with the care you need, when you need it.  Your next appointment:   1 year  Provider:   Dr. Kate  We recommend signing up for the patient portal called MyChart.  Sign up information is provided on this After Visit Summary.  MyChart is used to connect with patients for Virtual Visits (Telemedicine).  Patients are able to view lab/test results, encounter notes, upcoming appointments, etc.  Non-urgent messages can be sent to your provider as well.   To learn more about what you can do with MyChart, go to [email or web address].   Other Instructions none

## 2024-07-11 ENCOUNTER — Ambulatory Visit (HOSPITAL_COMMUNITY)
Admission: RE | Admit: 2024-07-11 | Discharge: 2024-07-11 | Disposition: A | Discharge status: Home / Self Care | Payer: Self-pay | Source: Ambulatory Visit | Attending: Cardiology | Admitting: Cardiology

## 2024-07-11 DIAGNOSIS — E785 Hyperlipidemia, unspecified: Secondary | ICD-10-CM | POA: Insufficient documentation

## 2024-07-12 ENCOUNTER — Ambulatory Visit: Payer: Self-pay | Admitting: Cardiology

## 2024-07-24 ENCOUNTER — Other Ambulatory Visit: Payer: Self-pay | Admitting: Cardiology
